# Patient Record
Sex: Male | Born: 1960
Health system: Southern US, Community
[De-identification: ages and names within clinical notes are randomized; demographics above are authoritative.]

## PROBLEM LIST (undated history)

## (undated) DIAGNOSIS — E785 Hyperlipidemia, unspecified: Secondary | ICD-10-CM

## (undated) DIAGNOSIS — I341 Nonrheumatic mitral (valve) prolapse: Secondary | ICD-10-CM

## (undated) DIAGNOSIS — R011 Cardiac murmur, unspecified: Secondary | ICD-10-CM

## (undated) DIAGNOSIS — K219 Gastro-esophageal reflux disease without esophagitis: Secondary | ICD-10-CM

## (undated) DIAGNOSIS — F419 Anxiety disorder, unspecified: Secondary | ICD-10-CM

## (undated) HISTORY — PX: MOUTH SURGERY: SHX715

## (undated) HISTORY — DX: Cardiac murmur, unspecified: R01.1

## (undated) HISTORY — DX: Gastro-esophageal reflux disease without esophagitis: K21.9

## (undated) HISTORY — PX: VASECTOMY: SHX75

## (undated) HISTORY — DX: Hyperlipidemia, unspecified: E78.5

## (undated) HISTORY — DX: Anxiety disorder, unspecified: F41.9

## (undated) HISTORY — DX: Nonrheumatic mitral (valve) prolapse: I34.1

---
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
## 1994-12-08 HISTORY — PX: LASIK: SHX215

## 2014-12-08 HISTORY — PX: MEDIAL PARTIAL KNEE REPLACEMENT: SHX5965

## 2015-12-21 DIAGNOSIS — M179 Osteoarthritis of knee, unspecified: Secondary | ICD-10-CM | POA: Insufficient documentation

## 2015-12-21 DIAGNOSIS — M1711 Unilateral primary osteoarthritis, right knee: Secondary | ICD-10-CM | POA: Insufficient documentation

## 2015-12-21 DIAGNOSIS — M1710 Unilateral primary osteoarthritis, unspecified knee: Secondary | ICD-10-CM | POA: Insufficient documentation

## 2016-11-18 LAB — HM COLONOSCOPY

## 2016-11-24 ENCOUNTER — Ambulatory Visit: Payer: Self-pay | Admitting: Podiatry

## 2016-12-18 ENCOUNTER — Encounter: Payer: Self-pay | Admitting: Gastroenterology

## 2017-02-17 DIAGNOSIS — R5381 Other malaise: Secondary | ICD-10-CM | POA: Diagnosis not present

## 2017-03-10 DIAGNOSIS — K219 Gastro-esophageal reflux disease without esophagitis: Secondary | ICD-10-CM | POA: Diagnosis not present

## 2017-03-17 ENCOUNTER — Encounter: Payer: Self-pay | Admitting: Gastroenterology

## 2017-04-15 ENCOUNTER — Ambulatory Visit (INDEPENDENT_AMBULATORY_CARE_PROVIDER_SITE_OTHER): Payer: 59 | Admitting: Gastroenterology

## 2017-04-15 ENCOUNTER — Encounter (INDEPENDENT_AMBULATORY_CARE_PROVIDER_SITE_OTHER): Payer: Self-pay

## 2017-04-15 ENCOUNTER — Encounter: Payer: Self-pay | Admitting: Gastroenterology

## 2017-04-15 ENCOUNTER — Ambulatory Visit: Payer: Self-pay | Admitting: Gastroenterology

## 2017-04-15 VITALS — BP 120/58 | HR 68 | Ht 74.0 in | Wt 219.0 lb

## 2017-04-15 DIAGNOSIS — Z86010 Personal history of colonic polyps: Secondary | ICD-10-CM

## 2017-04-15 DIAGNOSIS — K219 Gastro-esophageal reflux disease without esophagitis: Secondary | ICD-10-CM | POA: Diagnosis not present

## 2017-04-15 NOTE — Patient Instructions (Addendum)
Patient advised to avoid spicy, acidic, citrus, chocolate, mints, fruit and fruit juices.  Limit the intake of caffeine, alcohol and Soda.  Don't exercise too soon after eating.  Don't lie down within 3-4 hours of eating.  Elevate the head of your bed.  You have been scheduled for an endoscopy. Please follow written instructions given to you at your visit today. If you use inhalers (even only as needed), please bring them with you on the day of your procedure. Your physician has requested that you go to www.startemmi.com and enter the access code given to you at your visit today. This web site gives a general overview about your procedure. However, you should still follow specific instructions given to you by our office regarding your preparation for the procedure.  Please bring your previous Gastrointestinal records back to our office or you can fax them to [phone].  Thank you for choosing me and [facility].  Venita LickMalcolm T. Pleas KochStark, Jr., MD., Clementeen GrahamFACG

## 2017-04-15 NOTE — Progress Notes (Signed)
History of Present Illness: This is a 56 year old male referred by Serena Colonelosen, Jefry, MD for the evaluation of GERD and throat problems. He was evaluated by Dr. Serena ColonelJefry Rosen in April. ENT exam was unremarkable. Patient relates occasional sore throat and burning epigastric pain. The symptoms have been frequent over the past year. Patient relates a history of reflux problems frequently during his 30s and then for many years he did not notice reflux until the past year. He notes alcohol, late night eating, caffeine and chocolate precipitates symptoms. He has not previously undergone EGD. He has undergone colonoscopy on 2 occasions by his report and the most recent colon[place] in [place]. The patient relates he had 1 precancerous polyp on his most recent colonoscopy and a 5 year interval colonoscopy was recommended. Denies weight loss, abdominal pain, constipation, diarrhea, change in stool caliber, melena, hematochezia, nausea, vomiting, dysphagia, chest pain.   Not on File Outpatient Medications Prior to Visit  Medication Sig Dispense Refill  . Calcium Ascorbate 500 MG TABS Take 1 tablet by mouth daily.    . Ergocalciferol (VITAMIN D2) 2000 units TABS Take 1 tablet by mouth daily.    Marland Kitchen. omega-3 acid ethyl esters (LOVAZA) 1 g capsule Take 1 g by mouth daily.    . vitamin A 4098110000 UNIT capsule Take 10,000 Units by mouth daily.    . vitamin E 100 UNIT capsule Take 100 Units by mouth daily.     No facility-administered medications prior to visit.    No past medical history on file. Past Surgical History:  Procedure Laterality Date  . KNEE SURGERY Right 12/2015  . MOUTH SURGERY     Social History   Social History  . Marital status: Married    Spouse name: N/A  . Number of children: N/A  . Years of education: N/A   Social History Main Topics  . Smoking status: Never Smoker  . Smokeless tobacco: Never Used  . Alcohol use No  . Drug use: Unknown  . Sexual activity: Not  Asked   Other Topics Concern  . None   Social History Narrative  . None   History reviewed. No pertinent family history.    Review of Systems: Pertinent positive and negative review of systems were noted in the above HPI section. All other review of systems were otherwise negative.   Physical Exam: General: Well developed, well nourished, no acute distress Head: Normocephalic and atraumatic Eyes:  sclerae anicteric, EOMI Ears: Normal auditory acuity Mouth: No deformity or lesions Neck: Supple, no masses or thyromegaly Lungs: Clear throughout to auscultation Heart: Regular rate and rhythm; no murmurs, rubs or bruits Abdomen: Soft, non tender and non distended. No masses, hepatosplenomegaly or hernias noted. Normal Bowel sounds Rectal: not done Musculoskeletal: Symmetrical with no gross deformities  Skin: No lesions on visible extremities Pulses:  Normal pulses noted Extremities: No clubbing, cyanosis, edema or deformities noted Neurological: Alert oriented x 4, grossly nonfocal Cervical Nodes:  No significant cervical adenopathy Inguinal Nodes: No significant inguinal adenopathy Psychological:  Alert and cooperative. Normal mood and affect  Assessment and Recommendations:  1. GERD. R/O Barrett's, esophagitis. Follow all standard antireflux measures. Zantac OTC 150 mg twice a day when necessary and if this is not effective omeprazole 20 mg OTC daily as needed for management of symptoms. Schedule EGD. The risks (including bleeding, perforation, infection, missed lesions, medication reactions and possible hospitalization or surgery if complications occur), benefits, and alternatives to endoscopy with  possible biopsy and possible dilation were discussed with the patient and they consent to proceed.   2. Personal history of a precancerous colon polyp by pts history. Surveillance colonoscopy 11/2021. Attempt to obtain records from prior colonoscopies.   cc: Serena Colonel, MD [address] [place], [place] [address]

## 2017-05-21 ENCOUNTER — Encounter: Payer: Self-pay | Admitting: Gastroenterology

## 2017-05-22 DIAGNOSIS — M2550 Pain in unspecified joint: Secondary | ICD-10-CM | POA: Diagnosis not present

## 2017-05-22 DIAGNOSIS — R509 Fever, unspecified: Secondary | ICD-10-CM | POA: Diagnosis not present

## 2017-06-04 ENCOUNTER — Encounter: Payer: Self-pay | Admitting: Gastroenterology

## 2017-06-04 ENCOUNTER — Ambulatory Visit (AMBULATORY_SURGERY_CENTER): Payer: 59 | Admitting: Gastroenterology

## 2017-06-04 VITALS — BP 138/86 | HR 61 | Temp 98.9°F | Resp 14 | Ht 74.0 in | Wt 219.0 lb

## 2017-06-04 DIAGNOSIS — K219 Gastro-esophageal reflux disease without esophagitis: Secondary | ICD-10-CM | POA: Diagnosis present

## 2017-06-04 MED ORDER — SODIUM CHLORIDE 0.9 % IV SOLN: 500.0000 mL | INTRAVENOUS | Status: DC

## 2017-06-04 NOTE — Patient Instructions (Signed)
YOU HAD AN ENDOSCOPIC PROCEDURE TODAY AT THE [facility]:   Refer to the procedure report that was given to you for any specific questions about what was found during the examination.  If the procedure report does not answer your questions, please call your gastroenterologist to clarify.  If you requested that your care partner not be given the details of your procedure findings, then the procedure report has been included in a sealed envelope for you to review at your convenience later.  YOU SHOULD EXPECT: Some feelings of bloating in the abdomen. Passage of more gas than usual.  Walking can help get rid of the air that was put into your GI tract during the procedure and reduce the bloating. If you had a lower endoscopy (such as a colonoscopy or flexible sigmoidoscopy) you may notice spotting of blood in your stool or on the toilet paper. If you underwent a bowel prep for your procedure, you may not have a normal bowel movement for a few days.  Please Note:  You might notice some irritation and congestion in your nose or some drainage.  This is from the oxygen used during your procedure.  There is no need for concern and it should clear up in a day or so.  SYMPTOMS TO REPORT IMMEDIATELY:    Following upper endoscopy (EGD)  Vomiting of blood or coffee ground material  New chest pain or pain under the shoulder blades  Painful or persistently difficult swallowing  New shortness of breath  Fever of 100F or higher  Black, tarry-looking stools  Please see handouts given to you by your recovery nurse. For urgent or emergent issues, a gastroenterologist can be reached at any hour by calling [phone].   DIET:  We do recommend a small meal at first, but then you may proceed to your regular diet.  Drink plenty of fluids but you should avoid alcoholic beverages for 24 hours.  ACTIVITY:  You should plan to take it easy for the rest of today and you should NOT DRIVE or use heavy  machinery until tomorrow (because of the sedation medicines used during the test).    FOLLOW UP: Our staff will call the number listed on your records the next business day following your procedure to check on you and address any questions or concerns that you may have regarding the information given to you following your procedure. If we do not reach you, we will leave a message.  However, if you are feeling well and you are not experiencing any problems, there is no need to return our call.  We will assume that you have returned to your regular daily activities without incident.  If any biopsies were taken you will be contacted by phone or by letter within the next 1-3 weeks.  Please call us at [phone] if you have not heard about the biopsies in 3 weeks.    SIGNATURES/CONFIDENTIALITY: You and/or your care partner have signed paperwork which will be entered into your electronic medical record.  These signatures attest to the fact that that the information above on your After Visit Summary has been reviewed and is understood.  Full responsibility of the confidentiality of this discharge information lies with you and/or your care-partner.  Thank you for letting us take care of your healthcare needs today.

## 2017-06-04 NOTE — Progress Notes (Signed)
Report to PACU, RN, vss, BBS= Clear.  

## 2017-06-04 NOTE — Op Note (Signed)
[facility] Patient Name: Nathan Ward Procedure Date: 06/04/2017 10:06 AM MRN: [id number] Endoscopist: Meryl Dare , MD Age: 56 Referring MD:  Date of Birth: October 09, 1961 Gender: Male Account #: [id number] Procedure:                Upper GI endoscopy Indications:              Gastro-esophageal reflux disease Medicines:                Monitored Anesthesia Care Procedure:                Pre-Anesthesia Assessment:                           - Prior to the procedure, a History and Physical                            was performed, and patient medications and                            allergies were reviewed. The patient's tolerance of                            previous anesthesia was also reviewed. The risks                            and benefits of the procedure and the sedation                            options and risks were discussed with the patient.                            All questions were answered, and informed consent                            was obtained. Prior Anticoagulants: The patient has                            taken no previous anticoagulant or antiplatelet                            agents. ASA Grade Assessment: II - A patient with                            mild systemic disease. After reviewing the risks                            and benefits, the patient was deemed in                            satisfactory condition to undergo the procedure.                           After obtaining informed consent, the endoscope was  passed under direct vision. Throughout the                            procedure, the patient's blood pressure, pulse, and                            oxygen saturations were monitored continuously. The                            Endoscope was introduced through the mouth, and                            advanced to the second part of duodenum. The upper                            GI endoscopy was  accomplished without difficulty.                            The patient tolerated the procedure well. Scope In: Scope Out: Findings:                 The esophagus was normal.                           The stomach was normal.                           The examined duodenum was normal.                           The cardia and gastric fundus were normal on                            retroflexion. Complications:            No immediate complications. Estimated Blood Loss:     Estimated blood loss: none. Impression:               - Normal esophagus.                           - Normal stomach.                           - Normal examined duodenum.                           - No specimens collected. Recommendation:           - Patient has a contact number available for                            emergencies. The signs and symptoms of potential                            delayed complications were discussed with the  patient. Return to normal activities tomorrow.                            Written discharge instructions were provided to the                            patient.                           - Resume previous diet.                           - Antireflux measures.                           - Continue present medications. Meryl DareMalcolm T Stark, MD 06/04/2017 10:22:56 AM This report has been signed electronically.

## 2017-06-05 ENCOUNTER — Telehealth: Payer: Self-pay

## 2017-06-05 NOTE — Telephone Encounter (Signed)
  Follow up Call-  Call back number 06/04/2017  Post procedure Call Back phone  # [phone]  Permission to leave phone message Yes     Patient questions:  Do you have a fever, pain , or abdominal swelling? No. Pain Score  0 *  Have you tolerated food without any problems? Yes.    Have you been able to return to your normal activities? Yes.    Do you have any questions about your discharge instructions: Diet   No. Medications  No. Follow up visit  No.  Do you have questions or concerns about your Care? No.  Actions: * If pain score is 4 or above: No action needed, pain <4.

## 2017-10-08 DIAGNOSIS — Z23 Encounter for immunization: Secondary | ICD-10-CM | POA: Diagnosis not present

## 2018-01-21 DIAGNOSIS — L820 Inflamed seborrheic keratosis: Secondary | ICD-10-CM | POA: Diagnosis not present

## 2018-02-10 NOTE — Progress Notes (Signed)
Cardiology Office Note   Date:  02/12/2018   ID:  Dorn Hartshorne, DOB [birth year], MRN [id number]  PCP:  Renford Dills, MD  Cardiologist:   Charlton Haws, MD   No chief complaint on file.     History of Present Illness: Nathan Ward is a 57 y.o. male who presents for consultation regarding risk of CAD. He has history of MVP, anxiety HLD not wanting to take statins. Family history positive for premature  CAD.  Had cardiac CTA done at [facility] 06/24/2013 with isolated plaque in LAD Score 2.4 with no significant CAD in right dominant system. Commented on 50% or worse distal LAD disease but sounds like it was poorly visualized and just small distal LAD.   He has atypical chest and back pain Seems muscular positional not pleuritc Not always with Exertion Lasts minutes He anxiety level with he and mother regarding diagnosis of Lpa and  Family history of stroke and MI   Past Medical History:  Diagnosis Date  . Anxiety   . GERD (gastroesophageal reflux disease)   . Heart murmur    MVP  . Hyperlipidemia   . Mitral valve prolapse     Past Surgical History:  Procedure Laterality Date  . KNEE SURGERY Right 12/2015  . LASIK    . MOUTH SURGERY    . VASECTOMY       Current Outpatient Medications  Medication Sig Dispense Refill  . cholecalciferol (VITAMIN D) 400 units TABS tablet Take 400 Units by mouth as directed.    . vitamin A 40981 UNIT capsule Take 10,000 Units by mouth daily.    . vitamin E 100 UNIT capsule Take 100 Units by mouth daily.     Current Facility-Administered Medications  Medication Dose Route Frequency Provider Last Rate Last Dose  . 0.9 %  sodium chloride infusion  500 mL Intravenous Continuous Meryl Dare, MD        Allergies:   Sulfamethoxazole    Social History:  The patient  reports that  has never smoked. he has never used smokeless tobacco. He reports that he drinks about 0.6 oz of alcohol per week. He reports that he does not use drugs.     Family History:  The patient's family history includes Colon cancer in his father; Colon polyps in his father; Diabetes in his father and maternal uncle; Heart attack in his brother and maternal grandfather; Heart disease in his brother and maternal grandfather; Lymphoma in his father; Other in his father and maternal uncle.    ROS:  Please see the history of present illness.   Otherwise, review of systems are positive for none.   All other systems are reviewed and negative.    PHYSICAL EXAM: VS:  BP 136/76   Pulse 61   Ht 6\' 2"  (1.88 m)   Wt 209 lb (94.8 kg)   BMI 26.83 kg/m  , BMI Body mass index is 26.83 kg/m. Affect appropriate Healthy:  appears stated age HEENT: normal Neck supple with no adenopathy JVP normal no bruits no thyromegaly Lungs clear with no wheezing and good diaphragmatic motion Heart:  S1/S2 no murmur, no rub, gallop or click PMI normal Abdomen: benighn, BS positve, no tenderness, no AAA no bruit.  No HSM or HJR Distal pulses intact with no bruits No edema Neuro non-focal Skin warm and dry No muscular weakness    EKG:  2014 SR rate 51 normal    Recent Labs: No results found for requested  labs within last 8760 hours.    Lipid Panel No results found for: CHOL, TRIG, HDL, CHOLHDL, VLDL, LDLCALC, LDLDIRECT    Wt Readings from Last 3 Encounters:  02/11/18 209 lb (94.8 kg)  06/04/17 219 lb (99.3 kg)  04/15/17 219 lb (99.3 kg)      Other studies Reviewed: Additional studies/ records that were reviewed today include: Notes [facility] cardiology and cardiac CTA done 2014 .    ASSESSMENT AND PLAN:  1.  CAD by previous CT family history atypical pain f/u cardiac CTA/calcium score 2. MVP no murmur on exam no need for echo  3 HLD will see if calcium score above average for age and pursue statin Rx if so  4. Anxiety seems to drive cardiac evaluation    Current medicines are reviewed at length with the patient today.  The patient does not have concerns  regarding medicines.  The following changes have been made:  no change  Labs/ tests ordered today include: cardiac CTA/Calcium Score   Orders Placed This Encounter  Procedures  . CT CORONARY MORPH W/CTA COR W/SCORE W/CA W/CM &/OR WO/CM  . CT CORONARY FRACTIONAL FLOW RESERVE DATA PREP  . CT CORONARY FRACTIONAL FLOW RESERVE FLUID ANALYSIS  . Basic metabolic panel  . EKG 12-Lead     Disposition:   FU with cardiology PRN      Signed, Charlton HawsPeter Bethanny Toelle, MD  02/12/2018 2:28 PM    [facility] [address], [place], [place]  [address] Phone: [phone](336) [phone]; Fax: [phone]

## 2018-02-11 ENCOUNTER — Ambulatory Visit (INDEPENDENT_AMBULATORY_CARE_PROVIDER_SITE_OTHER): Payer: 59 | Admitting: Cardiovascular Disease

## 2018-02-11 ENCOUNTER — Encounter: Payer: Self-pay | Admitting: Cardiovascular Disease

## 2018-02-11 VITALS — BP 136/76 | HR 61 | Ht 74.0 in | Wt 209.0 lb

## 2018-02-11 DIAGNOSIS — E785 Hyperlipidemia, unspecified: Secondary | ICD-10-CM

## 2018-02-11 DIAGNOSIS — I341 Nonrheumatic mitral (valve) prolapse: Secondary | ICD-10-CM

## 2018-02-11 DIAGNOSIS — R079 Chest pain, unspecified: Secondary | ICD-10-CM | POA: Diagnosis not present

## 2018-02-11 DIAGNOSIS — I2510 Atherosclerotic heart disease of native coronary artery without angina pectoris: Secondary | ICD-10-CM

## 2018-02-11 NOTE — Patient Instructions (Addendum)
Medication Instructions:  Your physician recommends that you continue on your current medications as directed. Please refer to the Current Medication list given to you today.  Labwork: Your physician recommends that you return for lab work in: 1 week -BMET.   Testing/Procedures: Your physician has requested that you have cardiac CT. Cardiac computed tomography (CT) is a painless test that uses an x-ray machine to take clear, detailed pictures of your heart. For further information please visit [email or web address]. Please follow instruction sheet as given.  Follow-Up: Your physician wants you to follow-up in: 12 months with Dr. Eden EmmsNishan. You will receive a reminder letter in the mail two months in advance. If you don't receive a letter, please call our office to schedule the follow-up appointment.   If you need a refill on your cardiac medications before your next appointment, please call your pharmacy.  Please arrive at the [facility] main entrance of [facility] at xx:xx AM (30-45 minutes prior to test start time)  [facility] [address] [place], [place] [address] [phone]  Proceed to the [facility] Radiology Department (First Floor).  Please follow these instructions carefully (unless otherwise directed):  Hold all erectile dysfunction medications at least 48 hours prior to test.  On the Night Before the Test: . Drink plenty of water. . Do not consume any caffeinated/decaffeinated beverages or chocolate 12 hours prior to your test. . Do not take any antihistamines 12 hours prior to your test.  On the Day of the Test: . Drink plenty of water. Do not drink any water within one hour of the test. . Do not eat any food 4 hours prior to the test. . You may take your regular medications prior to the test. . IF NOT ON A BETA BLOCKER - Take 50 mg of lopressor (metoprolol) one hour before the test.  After the Test: . Drink plenty of water. . After  receiving IV contrast, you may experience a mild flushed feeling. This is normal. . On occasion, you may experience a mild rash up to 24 hours after the test. This is not dangerous. If this occurs, you can take Benadryl 25 mg and increase your fluid intake. . If you experience trouble breathing, this can be serious. If it is severe call 911 IMMEDIATELY. If it is mild, please call our office. . If you take any of these medications: Glipizide/Metformin, Avandament, Glucavance, please do not take 48 hours after completing test.

## 2018-02-12 ENCOUNTER — Telehealth: Payer: Self-pay

## 2018-02-12 NOTE — Telephone Encounter (Signed)
-----   Message from Ethelda ChickPamela Pate Ingalls, RN sent at 02/12/2018  3:52 PM EST ----- See if patient needs to take metoprolol before his cardiac CT.

## 2018-02-12 NOTE — Telephone Encounter (Signed)
Per Dr. Eden EmmsNishan, patient does not need metoprolol before his cardiac CT.

## 2018-02-15 ENCOUNTER — Other Ambulatory Visit: Payer: Self-pay

## 2018-02-18 ENCOUNTER — Other Ambulatory Visit: Payer: 59

## 2018-02-18 DIAGNOSIS — I341 Nonrheumatic mitral (valve) prolapse: Secondary | ICD-10-CM

## 2018-02-18 DIAGNOSIS — I2510 Atherosclerotic heart disease of native coronary artery without angina pectoris: Secondary | ICD-10-CM

## 2018-02-18 DIAGNOSIS — R079 Chest pain, unspecified: Secondary | ICD-10-CM

## 2018-02-18 DIAGNOSIS — E785 Hyperlipidemia, unspecified: Secondary | ICD-10-CM

## 2018-02-19 LAB — BASIC METABOLIC PANEL WITH GFR
BUN/Creatinine Ratio: 14 (ref 9–20)
BUN: 14 mg/dL (ref 6–24)
CO2: 22 mmol/L (ref 20–29)
Calcium: 9.1 mg/dL (ref 8.7–10.2)
Chloride: 103 mmol/L (ref 96–106)
Creatinine, Ser: 1 mg/dL (ref 0.76–1.27)
GFR calc Af Amer: 95 mL/min/{1.73_m2}
GFR calc non Af Amer: 83 mL/min/{1.73_m2}
Glucose: 89 mg/dL (ref 65–99)
Potassium: 4.3 mmol/L (ref 3.5–5.2)
Sodium: 141 mmol/L (ref 134–144)

## 2018-03-15 ENCOUNTER — Ambulatory Visit (HOSPITAL_COMMUNITY)
Admission: RE | Admit: 2018-03-15 | Discharge: 2018-03-15 | Disposition: A | Discharge status: Home / Self Care | Payer: 59 | Source: Ambulatory Visit | Attending: Cardiovascular Disease | Admitting: Cardiovascular Disease

## 2018-03-15 ENCOUNTER — Ambulatory Visit (HOSPITAL_COMMUNITY): Admission: RE | Admit: 2018-03-15 | Payer: 59 | Source: Ambulatory Visit

## 2018-03-15 DIAGNOSIS — R079 Chest pain, unspecified: Secondary | ICD-10-CM | POA: Insufficient documentation

## 2018-03-15 DIAGNOSIS — I341 Nonrheumatic mitral (valve) prolapse: Secondary | ICD-10-CM | POA: Insufficient documentation

## 2018-03-15 DIAGNOSIS — E785 Hyperlipidemia, unspecified: Secondary | ICD-10-CM | POA: Diagnosis not present

## 2018-03-15 DIAGNOSIS — I2510 Atherosclerotic heart disease of native coronary artery without angina pectoris: Secondary | ICD-10-CM | POA: Diagnosis not present

## 2018-03-15 MED ORDER — NITROGLYCERIN 0.4 MG SL SUBL
SUBLINGUAL_TABLET | SUBLINGUAL | Status: AC
  Administered 2018-03-15: 0.8 mg via SUBLINGUAL
  Filled 2018-03-15: qty 2

## 2018-03-15 MED ORDER — IOPAMIDOL (ISOVUE-370) INJECTION 76%
80.0000 mL | Freq: Once | INTRAVENOUS | Status: AC | PRN
  Administered 2018-03-15: 80 mL via INTRAVENOUS

## 2018-03-15 MED ORDER — NITROGLYCERIN 0.4 MG SL SUBL
0.8000 mg | SUBLINGUAL_TABLET | Freq: Once | SUBLINGUAL | Status: AC
  Administered 2018-03-15: 0.8 mg via SUBLINGUAL

## 2018-03-15 MED ORDER — IOPAMIDOL (ISOVUE-370) INJECTION 76%
INTRAVENOUS | Status: AC
  Filled 2018-03-15: qty 100

## 2018-03-15 MED ORDER — METOPROLOL TARTRATE 5 MG/5ML IV SOLN
INTRAVENOUS | Status: AC
  Filled 2018-03-15: qty 10

## 2018-03-15 MED ORDER — METOPROLOL TARTRATE 5 MG/5ML IV SOLN: 5.0000 mg | INTRAVENOUS | Status: DC | PRN

## 2018-03-16 ENCOUNTER — Telehealth: Payer: Self-pay

## 2018-03-16 DIAGNOSIS — E7800 Pure hypercholesterolemia, unspecified: Secondary | ICD-10-CM

## 2018-03-16 DIAGNOSIS — Z0000 Encounter for general adult medical examination without abnormal findings: Secondary | ICD-10-CM

## 2018-03-16 MED ORDER — ATORVASTATIN CALCIUM 10 MG PO TABS: 10.0000 mg | ORAL_TABLET | Freq: Every day | ORAL | 3 refills | Status: DC

## 2018-03-16 NOTE — Telephone Encounter (Signed)
-----   Message from Wendall StadePeter C Nishan, MD sent at 03/15/2018  3:53 PM EDT ----- I don't recall talking to him about Dr Drue Novelpaz but if he has no primary can refer His LDL is 140 with positive Lpa so if he wants to do everything he can To prevent MI would start lipitor 10 mg and f/u labs in 3 months

## 2018-03-16 NOTE — Telephone Encounter (Signed)
Patient stated he does not have a PCP. Will put in referral for Dr. Drue NovelPaz. Patient will start Lipitor 10 mg and will come in for fasting labs in 3 months.

## 2018-03-25 NOTE — Telephone Encounter (Signed)
Please advise 

## 2018-03-25 NOTE — Telephone Encounter (Signed)
Okay to schedule NP appt at his convenience.  

## 2018-03-25 NOTE — Telephone Encounter (Signed)
Appt scheduled 04/07/18 @10 :40

## 2018-03-25 NOTE — Telephone Encounter (Signed)
New Pt referral from Dr. Eden EmmsNishan- okay to schedule?

## 2018-03-25 NOTE — Telephone Encounter (Signed)
Please do

## 2018-04-06 DIAGNOSIS — I341 Nonrheumatic mitral (valve) prolapse: Secondary | ICD-10-CM | POA: Insufficient documentation

## 2018-04-06 DIAGNOSIS — E785 Hyperlipidemia, unspecified: Secondary | ICD-10-CM | POA: Insufficient documentation

## 2018-04-06 DIAGNOSIS — F419 Anxiety disorder, unspecified: Secondary | ICD-10-CM | POA: Insufficient documentation

## 2018-04-07 ENCOUNTER — Encounter: Payer: Self-pay | Admitting: Internal Medicine

## 2018-04-07 ENCOUNTER — Ambulatory Visit (INDEPENDENT_AMBULATORY_CARE_PROVIDER_SITE_OTHER): Payer: 59 | Admitting: Internal Medicine

## 2018-04-07 VITALS — BP 132/68 | HR 61 | Temp 98.1°F | Resp 16 | Ht 74.0 in | Wt 209.2 lb

## 2018-04-07 DIAGNOSIS — E785 Hyperlipidemia, unspecified: Secondary | ICD-10-CM

## 2018-04-07 NOTE — Patient Instructions (Signed)
GO TO THE FRONT DESK  Schedule labs to be done in 6 weeks, fasting Schedule your next appointment for a sickle exam in 4 to 5 months.  Fasting as well   GENERAL INFORMATION ABOUT HEALTHY EATING, CHOLESTEROL  The American Heart Association, www.heart.org  The American diabetes Association www.diabetes.org  "[facility] A to Z Health Guide" book

## 2018-04-07 NOTE — Progress Notes (Signed)
Pre visit review using our clinic review tool, if applicable. No additional management support is needed unless otherwise documented below in the visit note. 

## 2018-04-07 NOTE — Progress Notes (Signed)
Subjective:    Patient ID: Nathan Ward, male    DOB: [birth year], 59 y.o.   MRN: [id number]  DOS:  04/07/2018 Type of visit - description : New patient, referred by cardiology Interval history: In general the patient feels great, his main concern is to get established and work on his  cardiovascular risk reduction. Since he saw cardiology, coronary CT showed he is average risk for his age, cardiology recommended Lipitor 10 mg. He took it for a few days but stopped, liked to discuss with me.   Review of Systems he is actually doing great, in the last few weeks he has changed his diet dramatically, he remains very active taking walks every day and going to the gym regularly.   Past Medical History:  Diagnosis Date  . Anxiety   . GERD (gastroesophageal reflux disease)   . Heart murmur    MVP  . Hyperlipidemia   . Mitral valve prolapse 1976    Past Surgical History:  Procedure Laterality Date  . LASIK  1996  . MEDIAL PARTIAL KNEE REPLACEMENT Right 2016   in [place]  . MOUTH SURGERY    . VASECTOMY      Social History   Socioeconomic History  . Marital status: Married    Spouse name: Not on file  . Number of children: 3  . Years of education: Not on file  . Highest education level: Not on file  Occupational History  . Occupation: Engineer, manufacturing , Conservation officer, nature  . Occupation: finances   Social Needs  . Financial resource strain: Not on file  . Food insecurity:    Worry: Not on file    Inability: Not on file  . Transportation needs:    Medical: Not on file    Non-medical: Not on file  Tobacco Use  . Smoking status: Never Smoker  . Smokeless tobacco: Never Used  Substance and Sexual Activity  . Alcohol use: Yes    Alcohol/week: 0.6 oz    Types: 1 Glasses of wine per week    Comment: occ  . Drug use: No  . Sexual activity: Not on file  Lifestyle  . Physical activity:    Days per week: Not on file    Minutes per session: Not on file  . Stress: Not on file   Relationships  . Social connections:    Talks on phone: Not on file    Gets together: Not on file    Attends religious service: Not on file    Active member of club or organization: Not on file    Attends meetings of clubs or organizations: Not on file    Relationship status: Not on file  . Intimate partner violence:    Fear of current or ex partner: Not on file    Emotionally abused: Not on file    Physically abused: Not on file    Forced sexual activity: Not on file  Other Topics Concern  . Not on file  Social History Narrative   Moved from [place] 2017   2nd marriage      Family History  Problem Relation Age of Onset  . Colon cancer Father        14  . Colon polyps Father   . Diabetes Father   . Other Father        acute myelogenous leukemia  . Lymphoma Father        non-Hodgkin's  . Heart disease Brother   . Heart  attack Brother   . Diabetes Maternal Uncle   . Other Maternal Uncle        heart surgery: valve sx  . Heart disease Maternal Grandfather   . Heart attack Maternal Grandfather      Allergies as of 04/07/2018      Reactions   Sulfamethoxazole Hives      Medication List        Accurate as of 04/07/18 11:59 PM. Always use your most recent med list.          atorvastatin 10 MG tablet Commonly known as:  LIPITOR Take 1 tablet (10 mg total) by mouth daily.   [employer] OMEGA-3 KRILL OIL PO Take by mouth.   multivitamin tablet Take 1 tablet by mouth daily. BILLY'S INFINITY GREENS   OCUVITE ADULT 50+ Caps Take by mouth.          Objective:   Physical Exam BP 132/68 (BP Location: Right Arm, Patient Position: Sitting, Cuff Size: Normal)   Pulse 61   Temp 98.1 F (36.7 C) (Oral)   Resp 16   Ht  (1.88 m)   Wt 209 lb 4 oz (94.9 kg)   SpO2 98%   BMI 26.87 kg/m  General:   Well developed, well nourished . NAD.  HEENT:  Normocephalic . Face symmetric, atraumatic Neck: No thyromegaly Lungs:  CTA B Normal respiratory effort, no intercostal  retractions, no accessory muscle use. Heart: RRR,  no murmur.  No pretibial edema bilaterally  Skin: Not pale. Not jaundice Neurologic:  alert & oriented X3.  Speech normal, gait appropriate for age and unassisted Psych--  Cognition and judgment appear intact.  Cooperative with normal attention span and concentration.  Behavior appropriate. No anxious or depressed appearing.      Assessment & Plan:    Assessment  (new patient, 04/2018, referred by Dr. Eden Emms) Heart murmur, MVP dx 1976 per pt  Hyperlipidemia Anxiety GERD FH CAD 03/2018: CT coronary nephrology with a score: average for age, no obstructive CAD.  Plan: CV RF control  PLAN Mild dyslipidemia: The patient had a Lipo--profile several weeks ago, I cannot find the report in the chart, per Dr. Eden Emms notes LDL was 140. Given his family history I will set a LDL goal of 100. We discussed the role of diet and exercise.  Also the fact that Lipitor  Has beneficial  properties beyond lowering the LDL. We discussed fish oil and aspirin, that is  more controversial for primary prevention. At the end we agreed on continue lifestyle modification, Lipitor 10 mg daily, labs in 6 weeks and physical in 4 to 5 months. Also, I agreed to see his mother as a new patient

## 2018-04-08 DIAGNOSIS — Z09 Encounter for follow-up examination after completed treatment for conditions other than malignant neoplasm: Secondary | ICD-10-CM | POA: Insufficient documentation

## 2018-04-08 NOTE — Assessment & Plan Note (Signed)
Mild dyslipidemia: The patient had a Lipo--profile several weeks ago, I cannot find the report in the chart, per Dr. Eden Emms notes LDL was 140. Given his family history I will set a LDL goal of 100. We discussed the role of diet and exercise.  Also the fact that Lipitor  Has beneficial  properties beyond lowering the LDL. We discussed fish oil and aspirin, that is  more controversial for primary prevention. At the end we agreed on continue lifestyle modification, Lipitor 10 mg daily, labs in 6 weeks and physical in 4 to 5 months. Also, I agreed to see his mother as a new patient

## 2018-05-19 ENCOUNTER — Other Ambulatory Visit: Payer: 59

## 2018-05-24 ENCOUNTER — Telehealth: Payer: Self-pay | Admitting: Internal Medicine

## 2018-05-24 ENCOUNTER — Telehealth: Payer: Self-pay

## 2018-05-24 NOTE — Telephone Encounter (Signed)
Copied from CRM [phone]. Topic: Quick Communication - See Telephone Encounter >> May 24, 2018  1:33 PM Cipriano BunkerLambe, Annette S wrote: CRM for notification. See Telephone encounter for: 05/24/18.  He is asking for referral to Psychiatrist or pharmaceutist for medciation

## 2018-05-24 NOTE — Telephone Encounter (Signed)
Patient new to the area would like to be referred to a Psychiatrist (Not Psychologist per patient wants M.D.) for Anxiety and Depression. No major problems at this time but would like the referral as soon as possible per patient.

## 2018-05-24 NOTE — Telephone Encounter (Signed)
Advised patient, needs to contact his insurance, see what psychiatrist is in network and call made his own appointment. From previous experience, that may take several weeks, I will be happy to see him for evaluation. Please ask if he has any major symptoms like suicidal ideas if that is the case let me know.

## 2018-05-25 NOTE — Telephone Encounter (Signed)
Called patient states he does not want to check with his insurance. States he wants to know if you know a Psychiatrist personally that he could be referred to. Patient denies symptoms at this time when asked. States he will be in tomorrow to have blood drawn and if he could be given the name of a Psychiatrist it would be appreciated.

## 2018-05-25 NOTE — Telephone Encounter (Signed)
Recommend to reach out to:  [facility], Dr Jennelle Humanottle and others Phone: [phone]  [address], [address] [place] [address]   ===================  [facility]. Dr Donell BeersPlovsky and others [address] [place], [place] [address]  [phone]

## 2018-05-26 ENCOUNTER — Other Ambulatory Visit: Payer: 59

## 2018-05-26 NOTE — Telephone Encounter (Signed)
Called patient gave names and numbers of Dr. Jennelle Humanottle and Dr. Donell BeersPlovsky. Left detailed message on answering machine. Asked patient to return call if he has any questions.

## 2018-06-01 ENCOUNTER — Encounter (HOSPITAL_COMMUNITY): Payer: Self-pay | Admitting: Emergency Medicine

## 2018-06-01 ENCOUNTER — Telehealth: Payer: Self-pay | Admitting: Cardiovascular Disease

## 2018-06-01 ENCOUNTER — Ambulatory Visit (HOSPITAL_COMMUNITY)
Admission: EM | Admit: 2018-06-01 | Discharge: 2018-06-01 | Disposition: A | Discharge status: Home / Self Care | Payer: 59 | Attending: Family Medicine | Admitting: Family Medicine

## 2018-06-01 DIAGNOSIS — W450XXA Nail entering through skin, initial encounter: Secondary | ICD-10-CM

## 2018-06-01 DIAGNOSIS — S91332A Puncture wound without foreign body, left foot, initial encounter: Secondary | ICD-10-CM

## 2018-06-01 DIAGNOSIS — Z23 Encounter for immunization: Secondary | ICD-10-CM

## 2018-06-01 DIAGNOSIS — Z5189 Encounter for other specified aftercare: Secondary | ICD-10-CM

## 2018-06-01 MED ORDER — TETANUS-DIPHTH-ACELL PERTUSSIS 5-2.5-18.5 LF-MCG/0.5 IM SUSP
INTRAMUSCULAR | Status: AC
  Filled 2018-06-01: qty 0.5

## 2018-06-01 MED ORDER — TETANUS-DIPHTH-ACELL PERTUSSIS 5-2.5-18.5 LF-MCG/0.5 IM SUSP
0.5000 mL | Freq: Once | INTRAMUSCULAR | Status: AC
  Administered 2018-06-01: 0.5 mL via INTRAMUSCULAR

## 2018-06-01 NOTE — Telephone Encounter (Signed)
New message  Pt wants to know if he can get a referral to an endocrinologist. Please call

## 2018-06-01 NOTE — Discharge Instructions (Addendum)
Remember to keep a record of today's immunization in your wallet

## 2018-06-01 NOTE — ED Provider Notes (Signed)
[facility]   [id number] 06/01/18 Arrival Time: 0956   SUBJECTIVE:  ALANZO LAMB is a 57 y.o. male who presents to the urgent care with complaint of stepping on nail two days ago.  No pain or redness.  Just needs dT.     Past Medical History:  Diagnosis Date  . Anxiety   . GERD (gastroesophageal reflux disease)   . Heart murmur    MVP  . Hyperlipidemia   . Mitral valve prolapse 1976   Family History  Problem Relation Age of Onset  . Colon cancer Father        66  . Colon polyps Father   . Diabetes Father   . Other Father        acute myelogenous leukemia  . Lymphoma Father        non-Hodgkin's  . Heart disease Brother   . Heart attack Brother   . Diabetes Maternal Uncle   . Other Maternal Uncle        heart surgery: valve sx  . Heart disease Maternal Grandfather   . Heart attack Maternal Grandfather    Social History   Socioeconomic History  . Marital status: Married    Spouse name: Not on file  . Number of children: 3  . Years of education: Not on file  . Highest education level: Not on file  Occupational History  . Occupation: Engineer, manufacturing , Conservation officer, nature  . Occupation: finances   Social Needs  . Financial resource strain: Not on file  . Food insecurity:    Worry: Not on file    Inability: Not on file  . Transportation needs:    Medical: Not on file    Non-medical: Not on file  Tobacco Use  . Smoking status: Never Smoker  . Smokeless tobacco: Never Used  Substance and Sexual Activity  . Alcohol use: Yes    Alcohol/week: 0.6 oz    Types: 1 Glasses of wine per week    Comment: occ  . Drug use: No  . Sexual activity: Not on file  Lifestyle  . Physical activity:    Days per week: Not on file    Minutes per session: Not on file  . Stress: Not on file  Relationships  . Social connections:    Talks on phone: Not on file    Gets together: Not on file    Attends religious service: Not on file    Active member of club or  organization: Not on file    Attends meetings of clubs or organizations: Not on file    Relationship status: Not on file  . Intimate partner violence:    Fear of current or ex partner: Not on file    Emotionally abused: Not on file    Physically abused: Not on file    Forced sexual activity: Not on file  Other Topics Concern  . Not on file  Social History Narrative   Moved from [place] 2017   2nd marriage    Current Facility-Administered Medications for the 06/01/18 encounter [facility] Encounter)  Medication  . 0.9 %  sodium chloride infusion   No outpatient medications have been marked as taking for the 06/01/18 encounter [facility] Encounter).   Allergies  Allergen Reactions  . Sulfamethoxazole Hives      ROS: As per HPI, remainder of ROS negative.   OBJECTIVE:   Vitals:   06/01/18 1015 06/01/18 1016  Pulse:  (!) 46  Resp:  16  Temp:  98.3 F (36.8 C)  TempSrc:  Oral  SpO2:  99%  Weight: 209 lb (94.8 kg)      General appearance: alert; no distress Eyes: PERRL; EOMI; conjunctiva normal HENT: normocephalic; atraumatic; Neck: supple Extremities: no cyanosis or edema; symmetrical with no gross deformities Skin: warm and dry Neurologic: normal gait; grossly normal Psychological: alert and cooperative; normal mood and affect      Labs:  Results for orders placed or performed in visit on 02/18/18  Basic metabolic panel  Result Value Ref Range   Glucose 89 65 - 99 mg/dL   BUN 14 6 - 24 mg/dL   Creatinine, Ser 4.091.00 0.76 - 1.27 mg/dL   GFR calc non Af Amer 83 >59 mL/min/1.73   GFR calc Af Amer 95 >59 mL/min/1.73   BUN/Creatinine Ratio 14 9 - 20   Sodium 141 134 - 144 mmol/L   Potassium 4.3 3.5 - 5.2 mmol/L   Chloride 103 96 - 106 mmol/L   CO2 22 20 - 29 mmol/L   Calcium 9.1 8.7 - 10.2 mg/dL    Labs Reviewed - No data to display  No results found.     ASSESSMENT & PLAN:  1. Visit for wound check     Meds ordered this encounter  Medications  .  Tdap (BOOSTRIX) injection 0.5 mL    Reviewed expectations re: course of current medical issues. Questions answered. Outlined signs and symptoms indicating need for more acute intervention. Patient verbalized understanding. After Visit Summary given.    Procedures:      Elvina SidleLauenstein, Calvina Liptak, MD 06/01/18 1031

## 2018-06-01 NOTE — Telephone Encounter (Signed)
Left message for patient to call back. Left detailed message that patient could call PCP for referral.

## 2018-06-01 NOTE — ED Triage Notes (Signed)
PT stepped on a rusty nail two days ago and needed a tetanus shot

## 2018-06-07 ENCOUNTER — Encounter: Payer: Self-pay | Admitting: Internal Medicine

## 2018-06-07 ENCOUNTER — Ambulatory Visit (INDEPENDENT_AMBULATORY_CARE_PROVIDER_SITE_OTHER): Payer: 59 | Admitting: Internal Medicine

## 2018-06-07 VITALS — BP 124/76 | HR 59 | Temp 97.4°F | Resp 16 | Ht 74.0 in | Wt 205.5 lb

## 2018-06-07 DIAGNOSIS — E785 Hyperlipidemia, unspecified: Secondary | ICD-10-CM

## 2018-06-07 DIAGNOSIS — F419 Anxiety disorder, unspecified: Secondary | ICD-10-CM

## 2018-06-07 NOTE — Patient Instructions (Addendum)
See you in October  Cancel the lab appointment for July   Front desk: Please schedule pt's wife French Anaracy for a new pt visit w/ me tomorrow at 1.30 pm  Please get any available records

## 2018-06-07 NOTE — Progress Notes (Signed)
Subjective:    Patient ID: Nathan Ward, male    DOB: 1961/11/03, 57 y.o.   MRN: [id number]  DOS:  06/07/2018 Type of visit - description : Acute Interval history: Since the last office visit, he re- started Lipitor, he again felt that his mind was not sharp, he was forgetful. He decided to stop the medication a week ago and since then is very apparent that he is better.  He also got some records for my review.  Having some anxiety, mostly related to his wife who is having a lot of anxiety, depression and suicidal thoughts.  Review of Systems Overall other than above he is doing really well, he has changed his diet, is eating much healthier, he is very active, has lost some weight.   Past Medical History:  Diagnosis Date  . Anxiety   . GERD (gastroesophageal reflux disease)   . Heart murmur    MVP  . Hyperlipidemia   . Mitral valve prolapse 1976    Past Surgical History:  Procedure Laterality Date  . LASIK  1996  . MEDIAL PARTIAL KNEE REPLACEMENT Right 2016   in [place]  . MOUTH SURGERY    . VASECTOMY      Social History   Socioeconomic History  . Marital status: Married    Spouse name: Not on file  . Number of children: 3  . Years of education: Not on file  . Highest education level: Not on file  Occupational History  . Occupation: Engineer, manufacturing , Conservation officer, nature  . Occupation: finances   Social Needs  . Financial resource strain: Not on file  . Food insecurity:    Worry: Not on file    Inability: Not on file  . Transportation needs:    Medical: Not on file    Non-medical: Not on file  Tobacco Use  . Smoking status: Never Smoker  . Smokeless tobacco: Never Used  Substance and Sexual Activity  . Alcohol use: Yes    Alcohol/week: 0.6 oz    Types: 1 Glasses of wine per week    Comment: occ  . Drug use: No  . Sexual activity: Not on file  Lifestyle  . Physical activity:    Days per week: Not on file    Minutes per session: Not on file  . Stress:  Not on file  Relationships  . Social connections:    Talks on phone: Not on file    Gets together: Not on file    Attends religious service: Not on file    Active member of club or organization: Not on file    Attends meetings of clubs or organizations: Not on file    Relationship status: Not on file  . Intimate partner violence:    Fear of current or ex partner: Not on file    Emotionally abused: Not on file    Physically abused: Not on file    Forced sexual activity: Not on file  Other Topics Concern  . Not on file  Social History Narrative   Moved from [place] 2017   2nd marriage       Allergies as of 06/07/2018      Reactions   Sulfamethoxazole Hives      Medication List        Accurate as of 06/07/18  2:09 PM. Always use your most recent med list.          atorvastatin 10 MG tablet Commonly known as:  LIPITOR  Take 10 mg by mouth daily.   CVS OMEGA-3 KRILL OIL PO Take by mouth.   multivitamin tablet Take 1 tablet by mouth daily. [employer] INFINITY GREENS   OCUVITE ADULT 50+ Caps Take by mouth.          Objective:   Physical Exam BP 124/76 (BP Location: Left Arm, Patient Position: Sitting, Cuff Size: Small)   Pulse (!) 59   Temp (!) 97.4 F (36.3 C) (Oral)   Resp 16   Ht 6\' 2"  (1.88 m)   Wt 205 lb 8 oz (93.2 kg)   SpO2 98%   BMI 26.38 kg/m  General:   Well developed, NAD, see BMI.  HEENT:  Normocephalic . Face symmetric, atraumatic  Neurologic:  alert & oriented X3.  Speech normal, gait appropriate for age and unassisted Psych--  Cognition and judgment appear intact.  Cooperative with normal attention span and concentration.  Behavior appropriate. Slightly anxious but no depressed appearing.      Assessment & Plan:     Assessment  (new patient, 04/2018, referred by Dr. Eden EmmsNishan) Heart murmur, MVP dx 1976 per pt  Hyperlipidemia Anxiety GERD FH CAD 03/2018: CT coronary nephrology with a score: average for age, no obstructive CAD.  Plan: CV RF  control  PLAN Mild dyslipidemia: Started Lipitor and stopped a week ago because he feels that he become forgetful and his mind was not sharp, had similar experience in the past.  He has changed his diet, exercising more and would like to keep doing that and recheck his cholesterol in October. I shared with him some information from "UTD", indeed lipophilic statins have been linked w/ memory issues but that is not very common.  If we re-try statis will pick a  hydrophilic one such as Pravachol. Plan: Recheck cholesterol on RTC FH CAD: As above, not on aspirin Anxiety  Patient is concerned about his wife, she has expressed some suicidal ideas, I accepted her as a new patient and will see her tomorrow, until then  strongly recommend that she goes to go to the ER if S/I are intense, patient reports that he is closely checking on her. RTC October as planned

## 2018-06-07 NOTE — Progress Notes (Signed)
Pre visit review using our clinic review tool, if applicable. No additional management support is needed unless otherwise documented below in the visit note. 

## 2018-06-08 ENCOUNTER — Encounter: Payer: Self-pay | Admitting: Internal Medicine

## 2018-06-08 NOTE — Assessment & Plan Note (Signed)
Mild dyslipidemia: Started Lipitor and stopped a week ago because he feels that he become forgetful and his mind was not sharp, had similar experience in the past.  He has changed his diet, exercising more and would like to keep doing that and recheck his cholesterol in October. I shared with him some information from "UTD", indeed lipophilic statins have been linked w/ memory issues but that is not very common.  If we re-try statis will pick a  hydrophilic one such as Pravachol. Plan: Recheck cholesterol on RTC FH CAD: As above, not on aspirin Anxiety  Patient is concerned about his wife, she has expressed some suicidal ideas, I accepted her as a new patient and will see her tomorrow, until then  strongly recommend that she goes to go to the ER if S/I are intense, patient reports that he is closely checking on her. RTC October as planned

## 2018-06-21 ENCOUNTER — Other Ambulatory Visit: Payer: 59 | Admitting: *Deleted

## 2018-06-21 ENCOUNTER — Telehealth: Payer: Self-pay

## 2018-06-21 DIAGNOSIS — E7800 Pure hypercholesterolemia, unspecified: Secondary | ICD-10-CM

## 2018-06-21 LAB — LIPID PANEL
CHOL/HDL RATIO: 3.7 ratio (ref 0.0–5.0)
Cholesterol, Total: 199 mg/dL (ref 100–199)
HDL: 54 mg/dL (ref >39)
LDL CALC: 128 mg/dL — AB (ref 0–99)
Triglycerides: 86 mg/dL (ref 0–149)
VLDL CHOLESTEROL CAL: 17 mg/dL (ref 5–40)

## 2018-06-21 LAB — HEPATIC FUNCTION PANEL
ALBUMIN: 4.5 g/dL (ref 3.5–5.5)
ALK PHOS: 57 IU/L (ref 39–117)
ALT: 24 IU/L (ref 0–44)
AST: 23 IU/L (ref 0–40)
Bilirubin Total: 1 mg/dL (ref 0.0–1.2)
Bilirubin, Direct: 0.21 mg/dL (ref 0.00–0.40)
TOTAL PROTEIN: 6.2 g/dL (ref 6.0–8.5)

## 2018-06-21 NOTE — Telephone Encounter (Signed)
-----   Message from Wendall StadePeter C Nishan, MD sent at 06/21/2018  4:37 PM EDT ----- LDL is 128 given family history and CAD by CT would have him start lipitor 10 mg daily and f/u labs in 3 months with Dr Drue NovelPaz

## 2018-06-21 NOTE — Telephone Encounter (Addendum)
Called patient with lab results. Per Dr. Eden EmmsNishan, LDL is 128 given family history and CAD by CT would have him start lipitor 10 mg daily and f/u labs in 3 months with Dr Drue NovelPaz. Patient stated "Lipitor makes me stupid". Patient stated lipitor makes him very forgetful, and wanted to know if there was another medication he could take instead. Patient also informed me that he is on a Vegan diet that he started two weeks ago. Patient stated he has cut out gluten, dairy and no meat, except for fish every once in a while. Patient is wondering if this diet will work to get his LDL down. Will send message to Dr. Eden EmmsNishan for advisement.

## 2018-06-30 NOTE — Telephone Encounter (Signed)
Patient has follow up with Dr. Drue NovelPaz. No medication changes at this time.

## 2018-09-09 ENCOUNTER — Encounter: Payer: 59 | Admitting: Internal Medicine

## 2018-09-20 ENCOUNTER — Ambulatory Visit (HOSPITAL_COMMUNITY)
Admission: EM | Admit: 2018-09-20 | Discharge: 2018-09-20 | Disposition: A | Discharge status: Home / Self Care | Payer: 59

## 2018-09-20 ENCOUNTER — Encounter (HOSPITAL_COMMUNITY): Payer: Self-pay | Admitting: Emergency Medicine

## 2018-09-20 DIAGNOSIS — S0181XA Laceration without foreign body of other part of head, initial encounter: Secondary | ICD-10-CM

## 2018-09-20 NOTE — ED Triage Notes (Signed)
Pt here for wound to forehead

## 2018-09-20 NOTE — Discharge Instructions (Signed)
You may take 500mg Tylenol with ibuprofen 400-600mg every 6 hours for pain and inflammation. °

## 2018-09-20 NOTE — ED Provider Notes (Signed)
  MRN: [id number] DOB: 1961/03/23  Subjective:   Nathan Ward is a 57 y.o. male presenting for suffering a superficial facial laceration over his forehead.  Patient collided with a piece of wood off the side of his house.  Reports that he has his Tdap updated, received 1 2 months ago from stepping on a nail.  Patient came straight here and did not apply anything to his wound.    Denies taking any medications currently.     Allergies  Allergen Reactions  . Sulfamethoxazole Hives    Past Medical History:  Diagnosis Date  . Anxiety   . GERD (gastroesophageal reflux disease)   . Heart murmur    MVP  . Hyperlipidemia   . Mitral valve prolapse 1976     Past Surgical History:  Procedure Laterality Date  . LASIK  1996  . MEDIAL PARTIAL KNEE REPLACEMENT Right 2016   in [place]  . MOUTH SURGERY    . VASECTOMY      Objective:   Vitals: BP 133/69 (BP Location: Left Arm)   Pulse 60   Temp 98.4 F (36.9 C) (Oral)   Resp 20   SpO2 98%   Physical Exam  Constitutional: He is oriented to person, place, and time. He appears well-developed and well-nourished.  HENT:  Head:    Cardiovascular: Normal rate.  Pulmonary/Chest: Effort normal.  Neurological: He is alert and oriented to person, place, and time.   Wound cleansed using the wound cleansing spray here at our clinic.  1/8 inch Steri-Strips applied thereafter.  A total of 8 were used with good tension.  Assessment and Plan :   Superficial laceration of face  Wound care reviewed.  Return to clinic precautions provided. Use APAP and ibuprofen for pain and inflammation.      Wallis Bamberg, [place] 09/20/18 2032

## 2018-09-21 ENCOUNTER — Telehealth: Payer: Self-pay

## 2018-09-21 DIAGNOSIS — S0181XA Laceration without foreign body of other part of head, initial encounter: Secondary | ICD-10-CM

## 2018-09-21 NOTE — Telephone Encounter (Signed)
Need further information for referral.

## 2018-09-21 NOTE — Telephone Encounter (Signed)
Copied from CRM [id number]. Topic: Referral - Request for Referral >> Sep 20, 2018  6:02 PM Darletta Moll L wrote: Has patient seen PCP for this complaint? No. *If NO, is insurance requiring patient see PCP for this issue before PCP can refer them? Referral for which specialty: plastic surgery Preferred provider/office:  did not mention, hung up the phone when being told he needs to see Drue Novel first Reason for referral: mentioned "closing up his face"

## 2018-09-21 NOTE — Telephone Encounter (Signed)
Pt see at ED yesterday for laceration to face. He is requesting plastic surgery referral. Will place.

## 2018-09-21 NOTE — Telephone Encounter (Signed)
We are unsure of what further information you need?

## 2018-10-13 ENCOUNTER — Encounter: Payer: 59 | Admitting: Internal Medicine

## 2018-10-27 ENCOUNTER — Encounter: Payer: Self-pay | Admitting: Internal Medicine

## 2018-10-27 ENCOUNTER — Ambulatory Visit (INDEPENDENT_AMBULATORY_CARE_PROVIDER_SITE_OTHER): Payer: 59 | Admitting: Internal Medicine

## 2018-10-27 VITALS — BP 120/66 | HR 64 | Temp 98.8°F | Resp 16 | Ht 74.0 in | Wt 195.0 lb

## 2018-10-27 DIAGNOSIS — Z23 Encounter for immunization: Secondary | ICD-10-CM | POA: Diagnosis not present

## 2018-10-27 DIAGNOSIS — Z114 Encounter for screening for human immunodeficiency virus [HIV]: Secondary | ICD-10-CM | POA: Diagnosis not present

## 2018-10-27 DIAGNOSIS — Z0000 Encounter for general adult medical examination without abnormal findings: Secondary | ICD-10-CM | POA: Insufficient documentation

## 2018-10-27 DIAGNOSIS — Z125 Encounter for screening for malignant neoplasm of prostate: Secondary | ICD-10-CM

## 2018-10-27 DIAGNOSIS — Z1159 Encounter for screening for other viral diseases: Secondary | ICD-10-CM | POA: Diagnosis not present

## 2018-10-27 LAB — CBC WITH DIFFERENTIAL/PLATELET
BASOS PCT: 0.6 % (ref 0.0–3.0)
Basophils Absolute: 0.1 10*3/uL (ref 0.0–0.1)
EOS PCT: 1.3 % (ref 0.0–5.0)
Eosinophils Absolute: 0.1 10*3/uL (ref 0.0–0.7)
HCT: 46 % (ref 39.0–52.0)
Hemoglobin: 15.8 g/dL (ref 13.0–17.0)
LYMPHS ABS: 2.4 10*3/uL (ref 0.7–4.0)
Lymphocytes Relative: 27.6 % (ref 12.0–46.0)
MCHC: 34.5 g/dL (ref 30.0–36.0)
MCV: 89.6 fl (ref 78.0–100.0)
MONOS PCT: 6.6 % (ref 3.0–12.0)
Monocytes Absolute: 0.6 10*3/uL (ref 0.1–1.0)
NEUTROS ABS: 5.5 10*3/uL (ref 1.4–7.7)
NEUTROS PCT: 63.9 % (ref 43.0–77.0)
PLATELETS: 263 10*3/uL (ref 150.0–400.0)
RBC: 5.13 Mil/uL (ref 4.22–5.81)
RDW: 12.7 % (ref 11.5–15.5)
WBC: 8.6 10*3/uL (ref 4.0–10.5)

## 2018-10-27 LAB — PSA: PSA: 1.17 ng/mL (ref 0.10–4.00)

## 2018-10-27 LAB — LIPID PANEL
CHOLESTEROL: 196 mg/dL (ref 0–200)
HDL: 48.2 mg/dL (ref >39.00)
LDL Cholesterol: 131 mg/dL — ABNORMAL HIGH (ref 0–99)
NonHDL: 147.34
Total CHOL/HDL Ratio: 4
Triglycerides: 84 mg/dL (ref 0.0–149.0)
VLDL: 16.8 mg/dL (ref 0.0–40.0)

## 2018-10-27 LAB — BASIC METABOLIC PANEL
BUN: 10 mg/dL (ref 6–23)
CALCIUM: 9.3 mg/dL (ref 8.4–10.5)
CO2: 29 mEq/L (ref 19–32)
Chloride: 101 mEq/L (ref 96–112)
Creatinine, Ser: 0.81 mg/dL (ref 0.40–1.50)
GFR: 103.47 mL/min (ref >60.00)
Glucose, Bld: 92 mg/dL (ref 70–99)
Potassium: 3.8 mEq/L (ref 3.5–5.1)
SODIUM: 138 meq/L (ref 135–145)

## 2018-10-27 LAB — HEMOGLOBIN A1C: Hgb A1c MFr Bld: 5.3 % (ref 4.6–6.5)

## 2018-10-27 LAB — TSH: TSH: 1.6 u[IU]/mL (ref 0.35–4.50)

## 2018-10-27 NOTE — Assessment & Plan Note (Signed)
Td 05/2018.  Flu shot today Request Shingrix No. 1, provided.  RTC 2 months. CCS: Had 2 [place], [place] had 4 polyps, was recommended 5 years. Prostate cancer screening: DRE normal, check a PSA Labs: BMP, FLP, CBC, A1c, TSH, PSA, HIV, hep C Diet and exercise discussed

## 2018-10-27 NOTE — Progress Notes (Signed)
Subjective:    Patient ID: Nathan Ward, male    DOB: September 12, 1961, 57 y.o.   MRN: [id number]  DOS:  10/27/2018 Type of visit - description : cpx Here for a CPX In the last few months, he has very stressed at work, working long hours. His diet has not been as good as before.  Review of Systems  Other than above, a 14 point review of systems is negative    Past Medical History:  Diagnosis Date  . Anxiety   . GERD (gastroesophageal reflux disease)   . Heart murmur    MVP  . Hyperlipidemia   . Mitral valve prolapse 1976    Past Surgical History:  Procedure Laterality Date  . LASIK  1996  . MEDIAL PARTIAL KNEE REPLACEMENT Right 2016   in [place]  . MOUTH SURGERY    . VASECTOMY      Social History   Socioeconomic History  . Marital status: Married    Spouse name: Not on file  . Number of children: 3  . Years of education: Not on file  . Highest education level: Not on file  Occupational History  . 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 Occupation: finances   Social Needs  . Financial resource strain: Not on file  . Food insecurity:    Worry: Not on file    Inability: Not on file  . Transportation needs:    Medical: Not on file    Non-medical: Not on file  Tobacco Use  . Smoking status: Never Smoker  . Smokeless tobacco: Never Used  Substance and Sexual Activity  . Alcohol use: Yes    Alcohol/week: 1.0 standard drinks    Types: 1 Glasses of wine per week    Comment: occ  . Drug use: No  . Sexual activity: Yes  Lifestyle  . Physical activity:    Days per week: Not on file    Minutes per session: Not on file  . Stress: Not on file  Relationships  . Social connections:    Talks on phone: Not on file    Gets together: Not on file    Attends religious service: Not on file    Active member of club or organization: Not on file    Attends meetings of clubs or organizations: Not on file    Relationship status: Not on file  . Intimate partner  violence:    Fear of current or ex partner: Not on file    Emotionally abused: Not on file    Physically abused: Not on file    Forced sexual activity: Not on file  Other Topics Concern  . Not on file  Social History Narrative   Moved from [place] 2017   2nd marriage    Family History  Problem Relation Age of Onset  . Colon cancer Father        1765  . Colon polyps Father   . Diabetes Father   . Other Father        acute myelogenous leukemia  . Lymphoma Father        non-Hodgkin's  . Heart disease Brother   . Heart attack Brother   . Diabetes Maternal Uncle   . Other Maternal Uncle        heart surgery: valve sx  . Heart disease Maternal Grandfather   . Heart attack Maternal Grandfather   . Prostate cancer Neg Hx      Allergies as of 10/27/2018  Reactions   Sulfamethoxazole Hives      Medication List    as of 10/27/2018 11:59 PM   You have not been prescribed any medications.         Objective:   Physical Exam BP 120/66 (BP Location: Left Arm, Patient Position: Sitting, Cuff Size: Normal)   Pulse 64   Temp 98.8 F (37.1 C)   Resp 16   Ht 6\' 2"  (1.88 m)   Wt 195 lb (88.5 kg)   SpO2 99%   BMI 25.04 kg/m  General: Well developed, NAD, BMI noted Neck: No  thyromegaly  HEENT:  Normocephalic . Face symmetric, atraumatic Lungs:  CTA B Normal respiratory effort, no intercostal retractions, no accessory muscle use. Heart: RRR,  no murmur.  No pretibial edema bilaterally  Abdomen:  Not distended, soft, non-tender. No rebound or rigidity.   pending Rectal: External abnormalities: none. Normal sphincter tone. No rectal masses or tenderness.  Brown stools Prostate: Prostate gland firm and smooth, no enlargement, nodularity, tenderness, mass, asymmetry or induration Skin: Exposed areas without rash. Not pale. Not jaundice Neurologic:  alert & oriented X3.  Speech normal, gait appropriate for age and unassisted Strength symmetric and appropriate for age.   Psych: Cognition and judgment appear intact.  Cooperative with normal attention span and concentration.  Behavior appropriate. No anxious or depressed appearing.     Assessment & Plan:    Assessment  (new patient, 04/2018, referred by Dr. Eden Emms) Heart murmur, MVP dx 1976 per pt  Hyperlipidemia Anxiety GERD FH CAD 03/2018: CT coronary nephrology with a score: average for age, no obstructive CAD.  Plan: CV RF control  PLAN Hyperlipidemia: His diet was great but in the last few months has not been able to eat as healthy.  We will check a FLP, we again talk about possibly Pravachol.  CV RF 10 years: 6.7%.  This was discussed with the patient Anxiety: Counseled, has access to a counselor if needed, recommend yoga, meditation and other nonpharmacological measures. RTC 1 year depending on results

## 2018-10-27 NOTE — Progress Notes (Signed)
Pre visit review using our clinic review tool, if applicable. No additional management support is needed unless otherwise documented below in the visit note. 

## 2018-10-27 NOTE — Patient Instructions (Signed)
GO TO THE LAB : Get the blood work     GO TO THE FRONT DESK Schedule your next Shingrix shot in 2 to 3 months from today  Schedule your next appointment for a checkup in 1 year  [email or web address]   The 10-year ASCVD risk score Denman George(Goff DC Montez HagemanJr., et al., 2013) is: 6.7%   Values used to calculate the score:     Age: [age] years     Sex: Male     Is Non-Hispanic African American: No     Diabetic: No     Tobacco smoker: No     Systolic Blood Pressure: 120 mmHg     Is BP treated: No     HDL Cholesterol: 54 mg/dL     Total Cholesterol: 199 mg/dL

## 2018-10-28 LAB — HEPATITIS C ANTIBODY
Hepatitis C Ab: NONREACTIVE
SIGNAL TO CUT-OFF: 0.01 (ref <1.00)

## 2018-10-28 LAB — HIV ANTIBODY (ROUTINE TESTING W REFLEX): HIV: NONREACTIVE

## 2018-10-28 NOTE — Assessment & Plan Note (Signed)
Hyperlipidemia: His diet was great but in the last few months has not been able to eat as healthy.  We will check a FLP, we again talk about possibly Pravachol.  CV RF 10 years: 6.7%.  This was discussed with the patient Anxiety: Counseled, has access to a counselor if needed, recommend yoga, meditation and other nonpharmacological measures. RTC 1 year depending on results

## 2018-11-14 ENCOUNTER — Other Ambulatory Visit: Payer: Self-pay

## 2018-11-14 ENCOUNTER — Emergency Department (HOSPITAL_COMMUNITY)
Admission: EM | Admit: 2018-11-14 | Discharge: 2018-11-14 | Disposition: A | Discharge status: Home / Self Care | Payer: 59 | Attending: Emergency Medicine | Admitting: Emergency Medicine

## 2018-11-14 ENCOUNTER — Encounter (HOSPITAL_COMMUNITY): Payer: Self-pay | Admitting: Emergency Medicine

## 2018-11-14 DIAGNOSIS — Y93H9 Activity, other involving exterior property and land maintenance, building and construction: Secondary | ICD-10-CM | POA: Diagnosis not present

## 2018-11-14 DIAGNOSIS — S01511A Laceration without foreign body of lip, initial encounter: Secondary | ICD-10-CM | POA: Diagnosis not present

## 2018-11-14 DIAGNOSIS — Y92009 Unspecified place in unspecified non-institutional (private) residence as the place of occurrence of the external cause: Secondary | ICD-10-CM | POA: Insufficient documentation

## 2018-11-14 DIAGNOSIS — Y999 Unspecified external cause status: Secondary | ICD-10-CM | POA: Insufficient documentation

## 2018-11-14 DIAGNOSIS — W208XXA Other cause of strike by thrown, projected or falling object, initial encounter: Secondary | ICD-10-CM | POA: Insufficient documentation

## 2018-11-14 NOTE — ED Provider Notes (Signed)
[facility] EMERGENCY DEPARTMENT Provider Note   CSN: [id number] Arrival date & time: 11/14/18  1458    History   Chief Complaint Chief Complaint  Patient presents with  . Lip Laceration    HPI Nathan Ward is a 57 y.o. male who presents with a lip laceration. No significant PMH. He states he was cleaning up his shed and a shovel fell and caused a upper lip laceration. The accident happened just prior to arrival. He called his daughter who is a pediatric ICU MD sent her a picture of it and she told him he could probably come to have it glued. Bleeding is controlled with pressure. He applied ice to the area. His tetanus is UTD. He denies any other complaints.  HPI  Past Medical History:  Diagnosis Date  . Anxiety   . GERD (gastroesophageal reflux disease)   . Heart murmur    MVP  . Hyperlipidemia   . Mitral valve prolapse 1976    Patient Active Problem List   Diagnosis Date Noted  . Annual physical exam 10/27/2018  . PCP NOTES >>>>>>>> 04/08/2018  . Mitral valve prolapse 04/06/2018  . Hyperlipidemia 04/06/2018  . Anxiety 04/06/2018  . Laryngopharyngeal reflux (LPR) 03/10/2017    Past Surgical History:  Procedure Laterality Date  . LASIK  1996  . MEDIAL PARTIAL KNEE REPLACEMENT Right 2016   in [place]  . MOUTH SURGERY    . VASECTOMY          Home Medications    Prior to Admission medications   Not on File    Family History Family History  Problem Relation Age of Onset  . Colon cancer Father        1965  . Colon polyps Father   . Diabetes Father   . Other Father        acute myelogenous leukemia  . Lymphoma Father        non-Hodgkin's  . Heart disease Brother   . Heart attack Brother   . Diabetes Maternal Uncle   . Other Maternal Uncle        heart surgery: valve sx  . Heart disease Maternal Grandfather   . Heart attack Maternal Grandfather   . Prostate cancer Neg Hx     Social History Social History   Tobacco Use  . Smoking  status: Never Smoker  . Smokeless tobacco: Never Used  Substance Use Topics  . Alcohol use: Yes    Alcohol/week: 1.0 standard drinks    Types: 1 Glasses of wine per week    Comment: occ  . Drug use: No     Allergies   Sulfamethoxazole   Review of Systems Review of Systems  Skin: Positive for wound.  Neurological: Negative for syncope and headaches.     Physical Exam Updated Vital Signs BP (!) 143/80   Pulse 63   Temp 98 F (36.7 C) (Oral)   Resp 16   Ht 6\' 2"  (1.88 m)   Wt 88.5 kg   SpO2 100%   BMI 25.04 kg/m   Physical Exam  Constitutional: He is oriented to person, place, and time. He appears well-developed and well-nourished. No distress.  HENT:  Head: Normocephalic and atraumatic.  Eyes: Pupils are equal, round, and reactive to light. Conjunctivae are normal. Right eye exhibits no discharge. Left eye exhibits no discharge. No scleral icterus.  Neck: Normal range of motion.  Cardiovascular: Normal rate.  Pulmonary/Chest: Effort normal. No respiratory distress.  Abdominal:  He exhibits no distension.  Neurological: He is alert and oriented to person, place, and time.  Skin: Skin is warm and dry.  1cm vertical linear laceration over the right upper lip that crosses the vermillion border. Wound is well approximated and superficial  Psychiatric: He has a normal mood and affect. His behavior is normal.  Nursing note and vitals reviewed.    ED Treatments / Results  Labs (all labs ordered are listed, but only abnormal results are displayed) Labs Reviewed - No data to display  EKG None  Radiology No results found.  Procedures .Marland KitchenLaceration Repair Date/Time: 11/14/2018 3:29 PM Performed by: Bethel Born, PA-C Authorized by: Bethel Born, PA-C   Consent:    Consent obtained:  Verbal   Consent given by:  Patient   Risks discussed:  Poor cosmetic result and pain   Alternatives discussed:  No treatment Anesthesia (see MAR for exact dosages):     Anesthesia method:  None Laceration details:    Location:  Lip   Lip location:  Upper exterior lip   Length (cm):  1   Depth (mm):  1 Repair type:    Repair type:  Simple Pre-procedure details:    Preparation:  Patient was prepped and draped in usual sterile fashion Exploration:    Wound exploration: wound explored through full range of motion and entire depth of wound probed and visualized   Treatment:    Area cleansed with:  Shur-Clens   Amount of cleaning:  Standard   Irrigation method:  Tap   Visualized foreign bodies/material removed: no   Skin repair:    Repair method:  Tissue adhesive Approximation:    Approximation:  Close Post-procedure details:    Dressing:  Open (no dressing)   Patient tolerance of procedure:  Tolerated well, no immediate complications   (including critical care time)    Medications Ordered in ED Medications - No data to display   Initial Impression / Assessment and Plan / ED Course  I have reviewed the triage vital signs and the nursing notes.  Pertinent labs & imaging results that were available during my care of the patient were reviewed by me and considered in my medical decision making (see chart for details).  57 year old male presents with superficial upper lip laceration.  It was irrigated and repaired with Dermabond in the ED.  Wound care was discussed with the patient.  His tetanus is up-to-date.  He was given return precautions  Final Clinical Impressions(s) / ED Diagnoses   Final diagnoses:  Lip laceration, initial encounter    ED Discharge Orders    None       Bethel Born, PA-C 11/14/18 1605    Linwood Dibbles, MD 11/14/18 [phone]

## 2018-11-14 NOTE — Discharge Instructions (Signed)
The dermabond will come off on its own in 5-7 days Do not apply any ointment to the wound because this may make the dermabond break down faster Cover area while showering Follow up if needed

## 2018-11-14 NOTE — ED Triage Notes (Signed)
C/O of lip laceration from shovel falling and hitting pt. Bleeding controled at this time.

## 2018-11-14 NOTE — ED Notes (Signed)
ED Provider at bedside. 

## 2018-11-14 NOTE — ED Notes (Signed)
Patient verbalizes understanding of discharge instructions. Opportunity for questioning and answers were provided. Armband removed by staff, pt discharged from ED ambulatory.   

## 2018-12-29 ENCOUNTER — Ambulatory Visit: Payer: 59

## 2019-02-08 ENCOUNTER — Telehealth: Payer: Self-pay | Admitting: *Deleted

## 2019-02-08 NOTE — Telephone Encounter (Signed)
Notified pt he is due for 2nd shingrix and scheduled nurse visit for 02/10/19 at 4pm.

## 2019-02-10 ENCOUNTER — Ambulatory Visit (INDEPENDENT_AMBULATORY_CARE_PROVIDER_SITE_OTHER): Payer: 59

## 2019-02-10 DIAGNOSIS — Z23 Encounter for immunization: Secondary | ICD-10-CM | POA: Diagnosis not present

## 2019-02-10 NOTE — Progress Notes (Signed)
Patient here for second shingrix vaccine. VIS given. 0.38ml shingrix given in patient's left deltoid IM. Patient tolerated well.  Pre visit review using our clinic review tool, if applicable. No additional management support is needed unless otherwise documented below in the visit note.

## 2019-07-26 NOTE — Progress Notes (Deleted)
Virtual Visit via Video Note   This visit type was conducted due to national recommendations for restrictions regarding the COVID-19 Pandemic (e.g. social distancing) in an effort to limit this patient's exposure and mitigate transmission in our community.  Due to his co-morbid illnesses, this patient is at least at moderate risk for complications without adequate follow up.  This format is felt to be most appropriate for this patient at this time.  All issues noted in this document were discussed and addressed.  A limited physical exam was performed with this format.  Please refer to the patient's chart for his consent to telehealth for [facility].   Date:  07/26/2019   ID:  Nathan Ward, DOB [birth year], MRN [id number]  Patient Location: Home Provider Location: Office  PCP:  Colon Branch, MD  Cardiologist:   Johnsie Cancel Electrophysiologist:  None   Evaluation Performed:  Follow-Up Visit  Chief Complaint:  CAD Risk  History of Present Illness:    Nathan Ward is a 58 y.o. male first seen 02/11/18  regarding risk of CAD. He has history of MVP, anxiety HLD not wanting to take statins. Family history positive for premature CAD.  Had cardiac CTA done at [facility] 06/24/2013 with isolated plaque in LAD Score 2.4 with no significant CAD in right dominant system. Commented on 50% or worse distal LAD disease but sounds like it was poorly visualized and just small distal LAD.   He has atypical chest and back pain Seems muscular positional not pleuritc Not always with Exertion Lasts minutes High anxiety level with he and mother regarding diagnosis of Lpa and  Family history of stroke and MI  Reviewed CTA done 03/15/18 mild calcium ostium D1 and distal LAD Calcium score 8 43 rd percentile Less than 50% ostial D1 and 30% distal LAD Aortic root 4.1   Labs reviewed 10/27/18 LDL 131 Dr Larose Kells suggested he take statin   ***  The patient  does not have symptoms concerning for COVID-19 infection  (fever, chills, cough, or new shortness of breath).    Past Medical History:  Diagnosis Date  . Anxiety   . GERD (gastroesophageal reflux disease)   . Heart murmur    MVP  . Hyperlipidemia   . Mitral valve prolapse 1976   Past Surgical History:  Procedure Laterality Date  . LASIK  1996  . MEDIAL PARTIAL KNEE REPLACEMENT Right 2016   in [place]    . VASECTOMY       No outpatient medications have been marked as taking for the 08/08/19 encounter (Appointment) with Josue Hector, MD.     Allergies:   Sulfamethoxazole   Social History   Tobacco Use  . Smoking status: Never Smoker  . Smokeless tobacco: Never Used  Substance Use Topics  . Alcohol use: Yes    Alcohol/week: 1.0 standard drinks    Types: 1 Glasses of wine per week    Comment: occ  . Drug use: No     Family Hx: The patient's family history includes Colon cancer in his father; Colon polyps in his father; Diabetes in his father and maternal uncle; Heart attack in his brother and maternal grandfather; Heart disease in his brother and maternal grandfather; Lymphoma in his father; Other in his father and maternal uncle. There is no history of Prostate cancer.  ROS:   Please see the history of present illness.     All other systems reviewed and are negative.  Prior CV studies:   The following studies were reviewed today:  Cardiac CTA 03/15/18  Labs/Other Tests and Data Reviewed:    EKG:  SR rate 51 normal 02/11/18  Recent Labs: 10/27/2018: BUN 10; Creatinine, Ser 0.81; Hemoglobin 15.8; Platelets 263.0; Potassium 3.8; Sodium 138; TSH 1.60   Recent Lipid Panel Lab Results  Component Value Date/Time   CHOL 196 10/27/2018 01:59 PM   CHOL 199 06/21/2018 08:02 AM   TRIG 84.0 10/27/2018 01:59 PM   HDL 48.20 10/27/2018 01:59 PM   HDL 54 06/21/2018 08:02 AM   CHOLHDL 4 10/27/2018 01:59 PM   LDLCALC 131 (H) 10/27/2018 01:59 PM   LDLCALC 128 (H) 06/21/2018 08:02 AM    Wt Readings from Last 3  Encounters:  11/14/18 195 lb (88.5 kg)  10/27/18 195 lb (88.5 kg)  06/07/18 205 lb 8 oz (93.2 kg)     Objective:    Vital Signs:  There were no vitals taken for this visit.   Skin warm and dry No distress No tachypnea No JVP elevation  Neuro appears non focal No edema  Telephone visit no exam   ASSESSMENT & PLAN:    1. CAD:  Non obstructive in D1/Distal LAD  Normal ECG 2. HLD:  *** 3. History MVP  No murmur observe 4. Anxiety *** 5. Dilated aortic root 4.1 cm on CT ***  COVID-19 Education: The signs and symptoms of COVID-19 were discussed with the patient and how to seek care for testing (follow up with PCP or arrange E-visit).  The importance of social distancing was discussed today.  Time:   Today, I have spent 30 minutes with the patient with telehealth technology discussing the above problems.     Medication Adjustments/Labs and Tests Ordered: Current medicines are reviewed at length with the patient today.  Concerns regarding medicines are outlined above.   Tests Ordered:  ***  Medication Changes:  ***  Disposition:  Follow up f/u in a year   Signed, Charlton HawsPeter Sukhdeep Wieting, MD  07/26/2019 10:41 AM    [facility]

## 2019-08-05 ENCOUNTER — Telehealth: Payer: Self-pay

## 2019-08-05 NOTE — Telephone Encounter (Signed)
Spoke with pt who wanted to reschedule appt on Monday, 31st with Dr. Johnsie Cancel to October 2nd with Dr. Johnsie Cancel in office. Pt verbalized understanding and thanked me for the call.

## 2019-08-08 ENCOUNTER — Ambulatory Visit: Payer: 59 | Admitting: Cardiovascular Disease

## 2019-08-29 ENCOUNTER — Other Ambulatory Visit: Payer: Self-pay | Admitting: Cardiology

## 2019-08-29 DIAGNOSIS — Z20822 Contact with and (suspected) exposure to covid-19: Secondary | ICD-10-CM

## 2019-08-31 LAB — NOVEL CORONAVIRUS, NAA: SARS-CoV-2, NAA: NOT DETECTED

## 2019-09-08 ENCOUNTER — Ambulatory Visit: Payer: Self-pay

## 2019-09-08 NOTE — Progress Notes (Deleted)
Cardiology Office Note   Date:  09/08/2019   ID:  Nathan Ward, Nathan Ward 27-Feb-1961, MRN [id number]  PCP:  Wanda Plump, MD  Cardiologist:   Charlton Haws, MD   No chief complaint on file.     History of Present Illness: Nathan Ward is a 58 y.o. male first seen March 2019  regarding risk of CAD. He has history of MVP, anxiety HLD not wanting to take statins. Lipitor made him feel "stupid"  Family history positive for premature  CAD.  Had cardiac CTA done at [facility] 06/24/2013 with isolated plaque in LAD Score 2.4 with no significant CAD in right dominant system. Commented on 50% or worse distal LAD disease but sounds like it was poorly visualized and just small distal LAD.   Cardiac CTA 03/15/18 reviewed Calcium score 8 in D1/Distal LAD 43 rd percentile for age/sex No obstructive CAD Aortic root 4.1 cm   He has atypical chest and back pain Seems muscular positional not pleuritc Not always with Exertion Lasts minutes He anxiety level with he and mother regarding diagnosis of Lpa and  Family history of stroke and MI   Did not want to start statin due to reaction with lipitor poor cognition Indicated Vegan diet   ***     Past Medical History:  Diagnosis Date  . Anxiety   . GERD (gastroesophageal reflux disease)   . Heart murmur    MVP  . Hyperlipidemia   . Mitral valve prolapse 1976    Past Surgical History:  Procedure Laterality Date  . LASIK  1996  . MEDIAL PARTIAL KNEE REPLACEMENT Right 2016   in [place]  . MOUTH SURGERY    . VASECTOMY       No current outpatient medications on file.   No current facility-administered medications for this visit.     Allergies:   Sulfamethoxazole    Social History:  The patient  reports that he has never smoked. He has never used smokeless tobacco. He reports current alcohol use of about 1.0 standard drinks of alcohol per week. He reports that he does not use drugs.   Family History:  The patient's family history  includes Colon cancer in his father; Colon polyps in his father; Diabetes in his father and maternal uncle; Heart attack in his brother and maternal grandfather; Heart disease in his brother and maternal grandfather; Lymphoma in his father; Other in his father and maternal uncle.    ROS:  Please see the history of present illness.   Otherwise, review of systems are positive for none.   All other systems are reviewed and negative.    PHYSICAL EXAM: VS:  There were no vitals taken for this visit. , BMI There is no height or weight on file to calculate BMI. Affect appropriate Healthy:  appears stated age HEENT: normal Neck supple with no adenopathy JVP normal no bruits no thyromegaly Lungs clear with no wheezing and good diaphragmatic motion Heart:  S1/S2 no murmur, no rub, gallop or click PMI normal Abdomen: benighn, BS positve, no tenderness, no AAA no bruit.  No HSM or HJR Distal pulses intact with no bruits No edema Neuro non-focal Skin warm and dry No muscular weakness    EKG:  2014 SR rate 51 normal    Recent Labs: 10/27/2018: BUN 10; Creatinine, Ser 0.81; Hemoglobin 15.8; Platelets 263.0; Potassium 3.8; Sodium 138; TSH 1.60    Lipid Panel    Component Value Date/Time   CHOL  196 10/27/2018 1359   CHOL 199 06/21/2018 0802   TRIG 84.0 10/27/2018 1359   HDL 48.20 10/27/2018 1359   HDL 54 06/21/2018 0802   CHOLHDL 4 10/27/2018 1359   VLDL 16.8 10/27/2018 1359   LDLCALC 131 (H) 10/27/2018 1359   LDLCALC 128 (H) 06/21/2018 0802      Wt Readings from Last 3 Encounters:  11/14/18 195 lb (88.5 kg)  10/27/18 195 lb (88.5 kg)  06/07/18 205 lb 8 oz (93.2 kg)      Other studies Reviewed: Additional studies/ records that were reviewed today include: Notes [facility] cardiology and cardiac CTA done 2014 .    ASSESSMENT AND PLAN:  1. CAD by previous CT family history atypical pain  Non obstructive by cardiac CTA 03/2018 2. MVP no murmur on exam no need for echo  3 HLD ***  4. Anxiety seems to drive cardiac evaluation    Current medicines are reviewed at length with the patient today.  The patient does not have concerns regarding medicines.  The following changes have been made:  ***  Labs/ tests ordered today include:  ***  No orders of the defined types were placed in this encounter.    Disposition:   FU with cardiology in a year     Signed, Jenkins Rouge, MD  09/08/2019 8:55 AM    [address], [place], [place]  [address] Phone: [phone]; Fax: [phone]

## 2019-09-09 ENCOUNTER — Ambulatory Visit: Payer: Self-pay | Admitting: Cardiovascular Disease

## 2019-10-27 IMAGING — CT CT HEART MORP W/ CTA COR W/ SCORE W/ CA W/CM &/OR W/O CM
4 of 7 series · 8 of 20 positions shown, 9 images · IV contrast (APPLIED)
Comparison: None.

CLINICAL DATA: Chest pain

EXAM:
Cardiac CTA
MEDICATIONS:
Sub lingual nitro. 4mg and lopressor 10mg
TECHNIQUE: The patient was scanned on a Siemens [REDACTED]ice Force scanner. Gantry
rotation speed was 270 msecs. Collimation was .9mm. A 100 kV
prospective scan was triggered in the descending thoracic aorta at
111 HU's with 5% padding centered around 78% of the R-R interval.
Average HR during the scan was 61 bpm. The 3D data set was
interpreted on a dedicated work station using MPR, MIP and VRT
modes. A total of 80cc of contrast was used.

[Series 6: best diast 68 % · axial · 0.37mm/px · z∈[+1002,+1057]mm · 2 of 412 slices shown, 3 images]
[im 138/412  vessel]
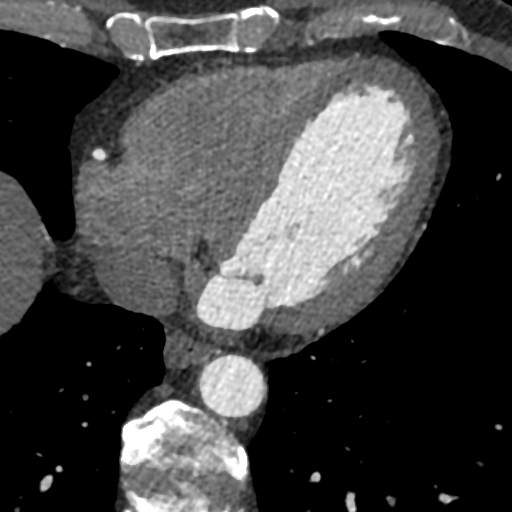
[im 138/412  lung]
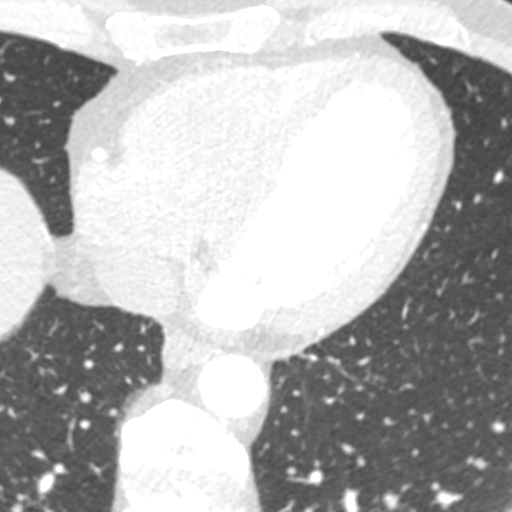
[im 275/412  vessel]
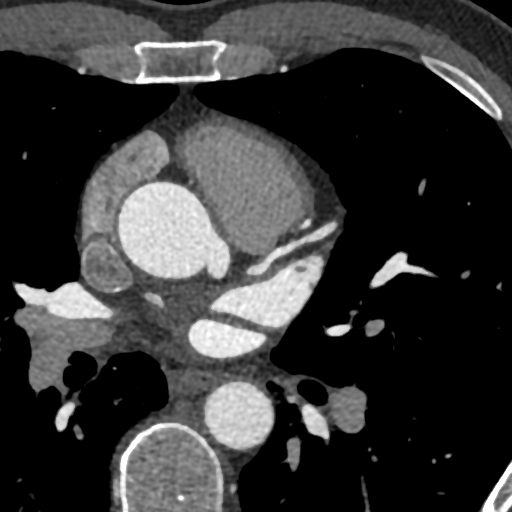

[Series 7: best syst 41 % · axial · 0.37mm/px · z∈[+1002,+1057]mm · 2 of 412 slices shown]
[im 138/412  vessel]
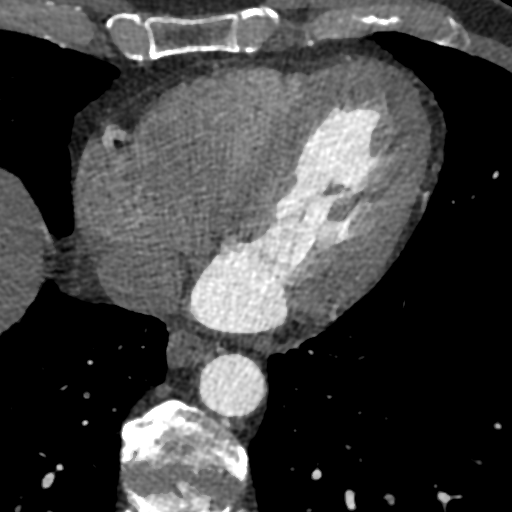
[im 275/412  vessel]
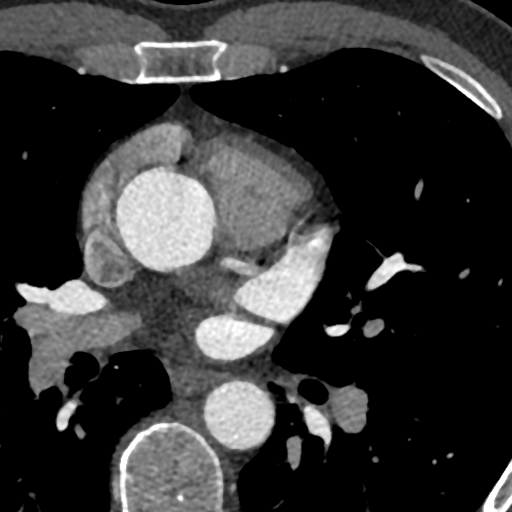

[Series 8: ts diast sharp 68 % · axial · 0.37mm/px · z∈[+1002,+1057]mm · 2 of 412 slices shown]
[im 138/412  lung]
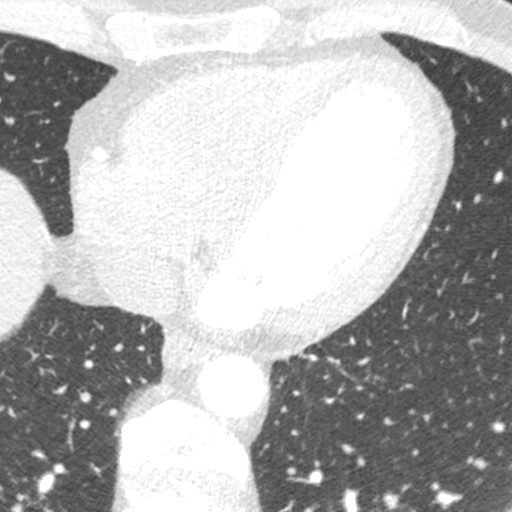
[im 275/412  lung]
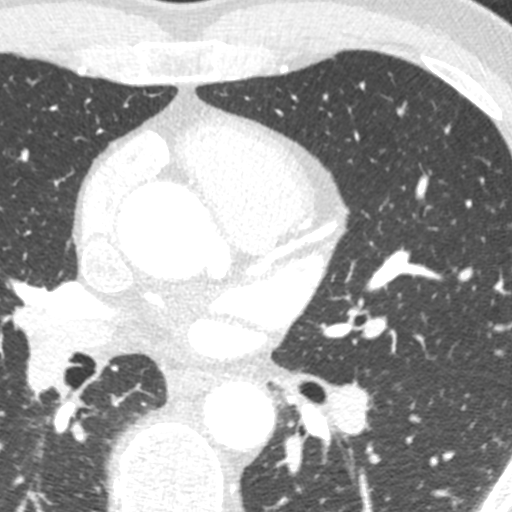

[Series 9: ts syst sharp 41 % · axial · 0.37mm/px · z∈[+1002,+1057]mm · 2 of 412 slices shown]
[im 138/412  lung]
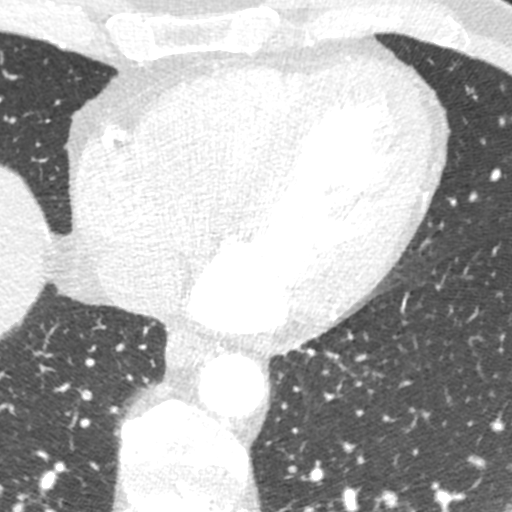
[im 275/412  lung]
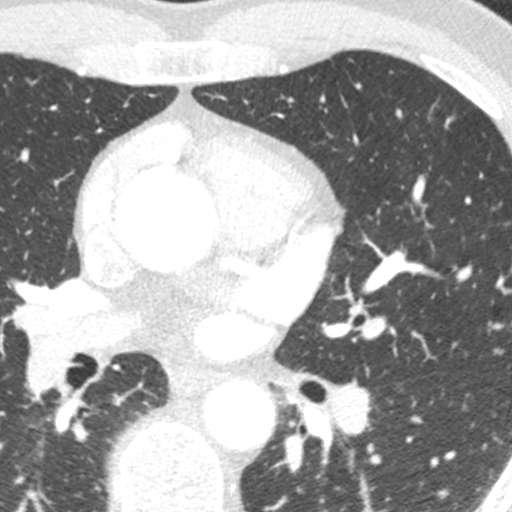

[8 of 20 positions shown; findings below may reference images not displayed]

FINDINGS: Non-cardiac: See separate report from [REDACTED]. No
significant findings on limited lung and soft tissue windows.

Calcium Score: Mild calcium noted at ostium D1 and distal LAD

Coronary Arteries: Right dominant with no anomalies

LM: Normal

LAD: Less than 30% distal calcific disease

D1: Less than 50% ostial calcific disease

D2: Normal

D3: Normal

IM: Small branch normal

Circumflex: Normal

OM1: Normal

OM2: Normal

OM3: Normal

RCA: Normal and dominant

PDA: Normal

PLA: Normal
IMPRESSION: 1. Calcium score 8 which is 43 [REDACTED] percentile for age and sex

2.  Mild aortic root dilatation 4.1 cm

3.  Non obstructive CAD involving the ostium of D1 and distal ODILIA

Paulus N Ceejay

EXAM:
OVER-READ INTERPRETATION  CT CHEST

The following report is an over-read performed by radiologist Dr.
Ilan Tiul [REDACTED] on 03/15/2018. This over-read
does not include interpretation of cardiac or coronary anatomy or
pathology. The coronary CTA interpretation by the cardiologist is
attached.
FINDINGS: Vascular: Heart is normal size.  Visualized aorta is normal caliber.

Mediastinum/Nodes: No adenopathy in the lower mediastinum or hila.

Lungs/Pleura: Visualized lungs clear.  No effusions.

Upper Abdomen: Imaging into the upper abdomen shows no acute
findings.

Musculoskeletal: Chest wall soft tissues are unremarkable. No acute
bony abnormality.
IMPRESSION: No acute or significant extracardiac abnormality.

## 2019-11-02 ENCOUNTER — Encounter: Payer: 59 | Admitting: Internal Medicine

## 2019-11-25 ENCOUNTER — Ambulatory Visit: Payer: 59 | Attending: Internal Medicine

## 2019-11-25 DIAGNOSIS — Z20822 Contact with and (suspected) exposure to covid-19: Secondary | ICD-10-CM

## 2019-11-27 LAB — NOVEL CORONAVIRUS, NAA: SARS-CoV-2, NAA: NOT DETECTED

## 2020-08-21 NOTE — Progress Notes (Signed)
Cardiology Office Note  Date:  08/21/2020   ID:  Nathan Ward, DOB 1961/08/28, MRN [id number]  PCP:  Wanda Plump, MD  Cardiologist:  Dr. Eden Emms  _____________  2 year follow-up  _____________   History of Present Illness: Nathan Ward is a 59 y.o. male has a history of CAD by CTA at [facility] 2014 showing isolated plaque in LAD with no significant CAD in right dominant system and small distal disease, HLD, MVP, family history of premature CAD, anxiety, and atypical chest pain.   He was last seen 02/11/2018 by Dr. Eden Emms reporting atypical chest and back pain. Not worse with exertion. No murmur on exam so no echo ordered. A coronary CT with a calcium score was ordered which showed calcium score or 8 and nonobstructive CAD. At that time Dr. Eden Emms considered statin however decided to watch this.   Today, he presents for 2 year follow-up. He was unable to be seen last due to COVID.He reports he has been having intermittent chest pain for the last 2-3 months. It occurs about every other day. Generally at rest. It is very dull, normally 1/10 and last 15-20 seconds. Once the pain went up to a 3/10 and patient pulled over while driving, however pain quickly resolved. Pain is not worse with exertion. When he feels the pain he has no associated symptoms. Pain resolves on it's own. He has never taken aspirin or nitro when he had the pain. Patient is a Veterinary surgeon and generally has a very active lifestyle although does not to regular exercise. He does yard work and other physical jobs and does not report chest pain. He has had indigestion in the past and this is different than that. He is a nonsmoker. Has family history of heart attacks, most family members in their 63s. Patient has taken statins before and says they cause cognitive changes. Diet is relatively healthy. Given his busy lifestyle he eats out 3-4 times weekly. BP a little high tiday. Tommi Rumps does not takes Bps at home although will start. Says he ate a lot of  salt last night. Patient is vaccinated and has not had COVID.   He denies symptoms of palpitations, shortness of breath, orthopnea, PND, lower extremity edema, claudication, dizziness, presyncope, syncope, bleeding, or neurologic sequela. The patient is tolerating medications without difficulties and is otherwise without complaint today.  _____________   Past Medical History:  Diagnosis Date  . Anxiety   . GERD (gastroesophageal reflux disease)   . Heart murmur    MVP  . Hyperlipidemia   . Mitral valve prolapse 1976   Past Surgical History:  Procedure Laterality Date  . LASIK  1996  . MEDIAL PARTIAL KNEE REPLACEMENT Right 2016   in [place]  . MOUTH SURGERY    . VASECTOMY     _____________  No current outpatient medications on file.   No current facility-administered medications for this visit.   _____________   Allergies:   Sulfamethoxazole  _____________   Social History:  The patient  reports that he has never smoked. He has never used smokeless tobacco. He reports current alcohol use of about 1.0 standard drink of alcohol per week. He reports that he does not use drugs.  _____________   Family History:  The patient's family history includes Colon cancer in his father; Colon polyps in his father; Diabetes in his father and maternal uncle; Heart attack in his brother and maternal grandfather; Heart disease in his brother and maternal grandfather; Lymphoma  in his father; Other in his father and maternal uncle.  _____________   ROS:  Please see the history of present illness.   Positive for atypical chest pain,   All other systems are reviewed and negative.  _____________   PHYSICAL EXAM: VS:  There were no vitals taken for this visit. , BMI There is no height or weight on file to calculate BMI. GEN: Well nourished, well developed, in no acute distress  HEENT: normal  Neck: no JVD, carotid bruits, or masses Cardiac: RRR; no murmurs, rubs, or gallops. No clubbing, cyanosis,  edema.  Radials/DP/PT 2+ and equal bilaterally.  Respiratory:  clear to auscultation bilaterally, normal work of breathing GI: soft, nontender, nondistended, + BS MS: no deformity or atrophy  Skin: warm and dry, no rash Neuro:  Strength and sensation are intact Psych: euthymic mood, full affect _____________  EKG:   The ekg ordered today shows NSR, 66bpm, LAD  Recent Labs: No results found for requested labs within last 8760 hours.  No results found for requested labs within last 8760 hours.  CrCl cannot be calculated (Patient's most recent lab result is older than the maximum 21 days allowed.).  Wt Readings from Last 3 Encounters:  11/14/18 195 lb (88.5 kg)  10/27/18 195 lb (88.5 kg)  06/07/18 205 lb 8 oz (93.2 kg)    _____________   ASSESSMENT AND PLAN:  Atypical chest pain with history of nonobstructive CAD  He reports 2-3 months of intermittent atypical chest pain. Its' dull and not worse with exertion. CTA at [facility] 2014 showing isolated plaque in LAD with no significant CAD in right dominant system and small distal disease. In 2019 he had a calcium score of 8 and otherwise nonobstructive CAD per coronary CT. Given strong family history of pre-mature CAD and HLD will order Exercise stress Myoview test. Anxiety could also be plating a role. Also check general labs today since it's been 2 years. BP a little high today, says he had a lot of salt last night.  Recommended obtain BP cuff and check daily Bps. Check FLP, he reports statin intolerance. Discussed lifestyle changes.   H/o MVP At the last visit no murmur on exam so no echo ordered. No significant murmur on exam today  HLD Reports statin intolerance (cognitive changes) with multiple statins.  Last labs in 2019 chol 196, HDL 48, LDL 131, TG 84. Repeat labs.    Disposition:   FU with with Dr. Eden Emms in 1 month   Signed, Burma Ketcher David Stall, PA-C 08/21/2020 2:09 PM    _____________ [facility] [address] [place] [address]  [phone] (office) [phone] (fax)

## 2020-08-23 ENCOUNTER — Ambulatory Visit: Payer: 59 | Admitting: Medical

## 2020-08-23 ENCOUNTER — Encounter: Payer: Self-pay | Admitting: *Deleted

## 2020-08-23 ENCOUNTER — Other Ambulatory Visit: Payer: Self-pay

## 2020-08-23 ENCOUNTER — Encounter: Payer: Self-pay | Admitting: Medical

## 2020-08-23 VITALS — BP 140/70 | HR 66 | Ht 74.0 in | Wt 210.0 lb

## 2020-08-23 DIAGNOSIS — R0789 Other chest pain: Secondary | ICD-10-CM

## 2020-08-23 DIAGNOSIS — I25119 Atherosclerotic heart disease of native coronary artery with unspecified angina pectoris: Secondary | ICD-10-CM

## 2020-08-23 DIAGNOSIS — I2510 Atherosclerotic heart disease of native coronary artery without angina pectoris: Secondary | ICD-10-CM | POA: Insufficient documentation

## 2020-08-23 DIAGNOSIS — Z8679 Personal history of other diseases of the circulatory system: Secondary | ICD-10-CM

## 2020-08-23 DIAGNOSIS — E785 Hyperlipidemia, unspecified: Secondary | ICD-10-CM

## 2020-08-23 NOTE — Patient Instructions (Addendum)
Medication Instructions:  Your physician recommends that you continue on your current medications as directed. Please refer to the Current Medication list given to you today.  *If you need a refill on your cardiac medications before your next appointment, please call your pharmacy*   Lab Work: Tuesday morning, 08/28/20:  Come to the office for LIPID, CBC, & BMET.. you can come anytime that day after 7:30 a.m.  If you have labs (blood work) drawn today and your tests are completely normal, you will receive your results only by:  MyChart Message (if you have MyChart) OR  A paper copy in the mail If you have any lab test that is abnormal or we need to change your treatment, we will call you to review the results.   Testing/Procedures: Your physician has requested that you have an exercise tolerance test. For further information please visit [email or web address] Please also follow instruction sheet, BELOW:    Please arrive 15 minutes prior to your appointment time for registration and insurance purposes.  The test will take approximately 45 minutes to complete.  How to prepare for your Exercise Stress Test:  Do bring a list of your current medications with you.  If not listed below, you may take your medications as normal.  Do wear comfortable clothes (no dresses or overalls) and walking shoes, tennis shoes preferred (no heels or open toed shoes are allowed)  Do Not wear cologne, perfume, aftershave or lotions (deodorant is allowed).  Please report to [address] [facility], [address] for your test.  If these instructions are not followed, your test will have to be rescheduled.  If you have questions or concerns about your appointment, you can call the Stress Lab at [phone].      Follow-Up: At [facility], you and your health needs are our priority.  As part of our continuing mission to provide you with exceptional heart care, we have created designated Provider Care  Teams.  These Care Teams include your primary Cardiologist (physician) and Advanced Practice Providers (APPs -  Physician Assistants and Nurse Practitioners) who all work together to provide you with the care you need, when you need it.  We recommend signing up for the patient portal called "MyChart".  Sign up information is provided on this After Visit Summary.  MyChart is used to connect with patients for Virtual Visits (Telemedicine).  Patients are able to view lab/test results, encounter notes, upcoming appointments, etc.  Non-urgent messages can be sent to your provider as well.   To learn more about what you can do with MyChart, go to [email or web address].    Your next appointment:   Keep your scheduled appointment with Dr. Eden Emms   The format for your next appointment:   In Person  Provider:   Charlton Haws, MD   Other Instructions Get you a blood pressure monitor and keep a log of your blood pressures daily and bring them to your appointment when you see Dr. Eden Emms

## 2020-08-28 ENCOUNTER — Other Ambulatory Visit: Payer: Self-pay

## 2020-09-10 ENCOUNTER — Inpatient Hospital Stay (HOSPITAL_COMMUNITY): Admission: RE | Admit: 2020-09-10 | Payer: Self-pay | Source: Ambulatory Visit

## 2020-09-13 ENCOUNTER — Ambulatory Visit (HOSPITAL_COMMUNITY): Admission: RE | Admit: 2020-09-13 | Payer: Self-pay | Source: Ambulatory Visit | Attending: Medical | Admitting: Medical

## 2020-09-21 ENCOUNTER — Ambulatory Visit: Payer: Self-pay | Attending: Internal Medicine

## 2020-09-21 ENCOUNTER — Other Ambulatory Visit (HOSPITAL_BASED_OUTPATIENT_CLINIC_OR_DEPARTMENT_OTHER): Payer: Self-pay | Admitting: Internal Medicine

## 2020-09-21 DIAGNOSIS — Z23 Encounter for immunization: Secondary | ICD-10-CM

## 2020-09-21 MED FILL — FLUARIX QUADRIVALENT 0.5 ML: 0.5 | 1 days supply | Qty: 1 | Fill #0

## 2020-09-21 NOTE — Progress Notes (Signed)
   Covid-19 Vaccination Clinic  Name:  Nathan Ward    MRN: [id number] DOB: 1961-04-01  09/21/2020  Mr. Passe was observed post Covid-19 immunization for 15 minutes without incident. He was provided with Vaccine Information Sheet and instruction to access the V-Safe system.  Vaccinated by [facility]  Mr. Breighner was instructed to call 911 with any severe reactions post vaccine: Marland Kitchen Difficulty breathing  . Swelling of face and throat  . A fast heartbeat  . A bad rash all over body  . Dizziness and weakness

## 2020-09-25 ENCOUNTER — Ambulatory Visit: Payer: 59 | Admitting: Cardiovascular Disease

## 2020-09-28 ENCOUNTER — Telehealth (HOSPITAL_COMMUNITY): Payer: Self-pay | Admitting: *Deleted

## 2020-09-28 NOTE — Telephone Encounter (Signed)
Close encounter 

## 2020-09-29 ENCOUNTER — Other Ambulatory Visit (HOSPITAL_COMMUNITY)
Admission: RE | Admit: 2020-09-29 | Discharge: 2020-09-29 | Disposition: A | Discharge status: Home / Self Care | Payer: Commercial Managed Care - PPO | Source: Ambulatory Visit | Attending: Cardiovascular Disease | Admitting: Cardiovascular Disease

## 2020-09-29 DIAGNOSIS — Z20822 Contact with and (suspected) exposure to covid-19: Secondary | ICD-10-CM | POA: Diagnosis not present

## 2020-09-29 DIAGNOSIS — Z01812 Encounter for preprocedural laboratory examination: Secondary | ICD-10-CM | POA: Diagnosis present

## 2020-09-29 LAB — SARS CORONAVIRUS 2 (TAT 6-24 HRS): SARS Coronavirus 2: NEGATIVE

## 2020-10-01 MED FILL — PFIZER-BIONTECH COVID-19 VA: 30 | 1 days supply | Qty: 0 | Fill #0

## 2020-10-02 ENCOUNTER — Other Ambulatory Visit: Payer: Self-pay

## 2020-10-02 ENCOUNTER — Ambulatory Visit (HOSPITAL_COMMUNITY)
Admission: RE | Admit: 2020-10-02 | Discharge: 2020-10-02 | Disposition: A | Discharge status: Home / Self Care | Payer: Commercial Managed Care - PPO | Source: Ambulatory Visit | Attending: Cardiovascular Disease | Admitting: Cardiovascular Disease

## 2020-10-02 DIAGNOSIS — R0789 Other chest pain: Secondary | ICD-10-CM | POA: Diagnosis present

## 2020-10-02 DIAGNOSIS — Z8679 Personal history of other diseases of the circulatory system: Secondary | ICD-10-CM | POA: Diagnosis not present

## 2020-10-02 DIAGNOSIS — E785 Hyperlipidemia, unspecified: Secondary | ICD-10-CM

## 2020-10-02 LAB — CBC
Hematocrit: 44 % (ref 37.5–51.0)
Hemoglobin: 15.5 g/dL (ref 13.0–17.7)
MCH: 31.1 pg (ref 26.6–33.0)
MCHC: 35.2 g/dL (ref 31.5–35.7)
MCV: 88 fL (ref 79–97)
Platelets: 215 10*3/uL (ref 150–450)
RBC: 4.98 x10E6/uL (ref 4.14–5.80)
RDW: 12.5 % (ref 11.6–15.4)
WBC: 6.4 10*3/uL (ref 3.4–10.8)

## 2020-10-02 LAB — BASIC METABOLIC PANEL
BUN/Creatinine Ratio: 16 (ref 10–24)
BUN: 14 mg/dL (ref 8–27)
CO2: 23 mmol/L (ref 20–29)
Calcium: 9.5 mg/dL (ref 8.6–10.2)
Chloride: 105 mmol/L (ref 96–106)
Creatinine, Ser: 0.87 mg/dL (ref 0.76–1.27)
GFR calc Af Amer: 108 mL/min/{1.73_m2} (ref >59)
GFR calc non Af Amer: 93 mL/min/{1.73_m2} (ref >59)
Glucose: 96 mg/dL (ref 65–99)
Potassium: 4.9 mmol/L (ref 3.5–5.2)
Sodium: 142 mmol/L (ref 134–144)

## 2020-10-02 LAB — LIPID PANEL
Chol/HDL Ratio: 3.9 ratio (ref 0.0–5.0)
Cholesterol, Total: 236 mg/dL — ABNORMAL HIGH (ref 100–199)
HDL: 61 mg/dL (ref >39)
LDL Chol Calc (NIH): 163 mg/dL — ABNORMAL HIGH (ref 0–99)
Triglycerides: 71 mg/dL (ref 0–149)
VLDL Cholesterol Cal: 12 mg/dL (ref 5–40)

## 2020-10-02 LAB — EXERCISE TOLERANCE TEST
Estimated workload: 17.2 METS
Exercise duration (min): 14 min
Exercise duration (sec): 0 s
MPHR: 159 {beats}/min
Peak HR: 160 {beats}/min
Percent HR: 100 %
Rest HR: 58 {beats}/min

## 2020-10-02 NOTE — Progress Notes (Signed)
CARDIOLOGY CONSULT NOTE       Patient ID: Nathan Ward MRN: [id number] DOB/AGE: Nov 22, 1961 59 y.o.  Admit date: (Not on file) Referring Physician: Drue Novel Primary Physician: Wanda Plump, MD Primary Cardiologist: Eden Emms Reason for Consultation: Chest Pain  Active Problems:   * No active hospital problems. *   HPI:  59 y.o. 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 Anxiety   . GERD (gastroesophageal reflux disease)   . Heart murmur    MVP  . Hyperlipidemia   . Mitral valve prolapse 1976    Family History  Problem Relation Age of Onset  . Colon cancer Father        6  . Colon polyps Father   . Diabetes Father   . Other Father        acute myelogenous leukemia  . Lymphoma Father        non-Hodgkin's  . Heart disease Brother   . Heart attack Brother   . Diabetes Maternal Uncle   . Other Maternal Uncle        heart surgery: valve sx  . Heart disease Maternal Grandfather   . Heart attack Maternal Grandfather   . Prostate cancer Neg Hx     Social History   Socioeconomic History  . Marital status: Married    Spouse name: Not on file  .  Number of children: 3  . Years of education: Not on file  . Highest education level: Not on file  Occupational History  . Occupation: Engineer, manufacturing , Conservation officer, nature  . Occupation: finances   Tobacco Use  . Smoking status: Never Smoker  . Smokeless tobacco: Never Used  Vaping Use  . Vaping Use: Never used  Substance and Sexual Activity  . Alcohol use: Yes    Alcohol/week: 1.0 standard drink    Types: 1 Glasses of wine per week    Comment: occ  . Drug use: No  . Sexual activity: Yes  Other Topics Concern  . Not on file  Social History Narrative   Moved from [place] 2017   2nd marriage    Social Determinants of Health   Financial Resource Strain:   . Difficulty of Paying Living Expenses: Not on file  Food Insecurity:   . Worried About Programme researcher, broadcasting/film/video in the Last Year: Not on file  . Ran Out of Food in the Last Year: Not on file  Transportation Needs:   . Lack of Transportation (Medical): Not on file  . Lack of Transportation (Non-Medical): Not on file  Physical Activity:   . Days  of Exercise per Week: Not on file  . Minutes of Exercise per Session: Not on file  Stress:   . Feeling of Stress : Not on file  Social Connections:   . Frequency of Communication with Friends and Family: Not on file  . Frequency of Social Gatherings with Friends and Family: Not on file  . Attends Religious Services: Not on file  . Active Member of Clubs or Organizations: Not on file  . Attends Banker Meetings: Not on file  . Marital Status: Not on file  Intimate Partner Violence:   . Fear of Current or Ex-Partner: Not on file  . Emotionally Abused: Not on file  . Physically Abused: Not on file  . Sexually Abused: Not on file    Past Surgical History:  Procedure Laterality Date  . LASIK  1996  . MEDIAL PARTIAL KNEE REPLACEMENT Right 2016   in [place]  . MOUTH SURGERY    . VASECTOMY       No current outpatient medications on file.    Physical Exam: Blood pressure  112/70, pulse 60, height 6\' 2"  (1.88 m), weight 208 lb (94.3 kg), SpO2 98 %.    Affect appropriate Healthy:  appears stated age HEENT: normal Neck supple with no adenopathy JVP normal no bruits no thyromegaly Lungs clear with no wheezing and good diaphragmatic motion Heart:  S1/S2 no murmur, no rub, gallop or click PMI normal Abdomen: benighn, BS positve, no tenderness, no AAA no bruit.  No HSM or HJR Distal pulses intact with no bruits No edema Neuro non-focal Skin warm and dry No muscular weakness   Labs:   Lab Results  Component Value Date   WBC 6.4 10/02/2020   HGB 15.5 10/02/2020   HCT 44.0 10/02/2020   MCV 88 10/02/2020   PLT 215 10/02/2020    Recent Labs  Lab 10/02/20 1112  NA 142  K 4.9  CL 105  CO2 23  BUN 14  CREATININE 0.87  CALCIUM 9.5  GLUCOSE 96   No results found for: CKTOTAL, CKMB, CKMBINDEX, TROPONINI  Lab Results  Component Value Date   CHOL 236 (H) 10/02/2020   CHOL 196 10/27/2018   CHOL 199 06/21/2018   Lab Results  Component Value Date   HDL 61 10/02/2020   HDL 48.20 10/27/2018   HDL 54 06/21/2018   Lab Results  Component Value Date   LDLCALC 163 (H) 10/02/2020   LDLCALC 131 (H) 10/27/2018   LDLCALC 128 (H) 06/21/2018   Lab Results  Component Value Date   TRIG 71 10/02/2020   TRIG 84.0 10/27/2018   TRIG 86 06/21/2018   Lab Results  Component Value Date   CHOLHDL 3.9 10/02/2020   CHOLHDL 4 10/27/2018   CHOLHDL 3.7 06/21/2018   No results found for: LDLDIRECT    Radiology: Exercise Tolerance Test  Addendum Date: 10/02/2020    Excellent exercise capacity, achieved 17.2 METS  Normal BP response to exercise  Upsloping ST segment depression was noted during stress, however appeared more horizontal in leads III, aVF, V6. While horizontal ST depression suggestive of ischemia, low suspicion for significant ischemia given excellent exercise capacity, achieving 17 METS    Result Date: 10/02/2020  Excellent exercise  capacity, achieved 17.2 METS  Normal BP response to exercise  Upsloping ST segment depression was noted during stress, however appeared more horizontal in leads III, aVF, V6. While horizontal ST depression suggestive of ischemia, low suspicion for significant ischemia given excellent exercise capacity, achieving  17 METS     EKG: 08/23/20 SR normal    ASSESSMENT AND PLAN:   1. Chest Pain:  Family history, elevated lipids not on Rx non obstructive disease LAD/d1 cardiac CT 2019 normal ETT at 17 METS 10/02/20 observe 2. HLD:  Refer to lipid clinic favor starting PSK9 he can give himself shots and will check on pricing  3. MVP:  Historical diagnosis no murmur on exam observe 4. Anxiety improved f/u primary   F/U in a year F/U lipid clinic start ? PSK9  Signed: Charlton Haws 10/03/2020, 10:33 AM

## 2020-10-03 ENCOUNTER — Ambulatory Visit: Payer: Commercial Managed Care - PPO | Admitting: Cardiovascular Disease

## 2020-10-03 ENCOUNTER — Encounter: Payer: Self-pay | Admitting: Cardiovascular Disease

## 2020-10-03 VITALS — BP 112/70 | HR 60 | Ht 74.0 in | Wt 208.0 lb

## 2020-10-03 DIAGNOSIS — E785 Hyperlipidemia, unspecified: Secondary | ICD-10-CM

## 2020-10-03 DIAGNOSIS — I25119 Atherosclerotic heart disease of native coronary artery with unspecified angina pectoris: Secondary | ICD-10-CM

## 2020-10-03 NOTE — Patient Instructions (Addendum)
Medication Instructions:  *If you need a refill on your cardiac medications before your next appointment, please call your pharmacy*  Lab Work: If you have labs (blood work) drawn today and your tests are completely normal, you will receive your results only by: Marland Kitchen MyChart Message (if you have MyChart) OR . A paper copy in the mail If you have any lab test that is abnormal or we need to change your treatment, we will call you to review the results.  Testing/Procedures: None ordered today.   Follow-Up: At [facility], you and your health needs are our priority.  As part of our continuing mission to provide you with exceptional heart care, we have created designated Provider Care Teams.  These Care Teams include your primary Cardiologist (physician) and Advanced Practice Providers (APPs -  Physician Assistants and Nurse Practitioners) who all work together to provide you with the care you need, when you need it.  We recommend signing up for the patient portal called "MyChart".  Sign up information is provided on this After Visit Summary.  MyChart is used to connect with patients for Virtual Visits (Telemedicine).  Patients are able to view lab/test results, encounter notes, upcoming appointments, etc.  Non-urgent messages can be sent to your provider as well.   To learn more about what you can do with MyChart, go to [email or web address].    Your next appointment:   12 month(s)  The format for your next appointment:   In Person  Provider:   You may see Dr. Eden Emms or one of the following Advanced Practice Providers on your designated Care Team:    Norma Fredrickson, NP  Nada Boozer, NP  Georgie Chard, NP  You have been referred to Lipid Clinic.

## 2020-10-23 NOTE — Progress Notes (Unsigned)
Patient ID: JIMI SCHAPPERT                 DOB: 17-Jun-1961                    MRN: [id number]     HPI: CHAS AXEL is a 59 y.o. male patient referred to lipid clinic by Dr. Eden Emms. PMH is significant for  anxiety, MVP, HLD. Cardiac CTA 2014 with calcium score 2.4 plaques in LAD Worried about family history of Lpa stroke and MI F/U cardiac CTA 03/15/18 with calcium score 8, 43rd percentile. Aortic root 4.1 cm and non obstructive disease in distal LAD and D1.   Seen by PA 08/23/20 complained of intermittent SSCP for 2-3 months lasting seconds and at rest (UA?)***.   Patient presents today. He does not like statins as he has been on them in past and had cognitive changes.   PCSK-9?  Another statin? Simvastatin and atorvastatin can cause more memory changes  Nexlizet and Repatha  tier 2 (copay?)  Current Medications: none Intolerances: atorvastatin 10 mg daily (cognitive changes) Risk Factors: nonobstructive CAD, progressive CAD (unstable angina) LDL goal: <70 mg/dL  Diet:   Exercise:   Family History: Colon cancer in his father, diabetes in his father, lymphoma in his father, heart disease in his brother and maternal grandfather.  Social History: Never smoker. Reports he does not use drugs  Labs: 10/02/20 TC 236 TG 71 HDL 61 LDL 163 without medication  Past Medical History:  Diagnosis Date  . Anxiety   . GERD (gastroesophageal reflux disease)   . Heart murmur    MVP  . Hyperlipidemia   . Mitral valve prolapse 1976    No current outpatient medications on file prior to visit.   No current facility-administered medications on file prior to visit.    Allergies  Allergen Reactions  . Sulfamethoxazole Hives    Assessment/Plan:  1. Hyperlipidemia - Patient presents with LDL above goal of <55 mg/dL.

## 2020-10-24 ENCOUNTER — Ambulatory Visit: Payer: Commercial Managed Care - PPO

## 2020-11-05 ENCOUNTER — Other Ambulatory Visit: Payer: Commercial Managed Care - PPO

## 2020-11-17 NOTE — Progress Notes (Unsigned)
Patient ID: Nathan Ward                 DOB: [birth year]                    MRN: [id number]     HPI: Nathan Ward is a 59 y.o. male patient referred to lipid clinic by Dr. Eden Emms. 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 Last seen by Dr. Eden Emms on 10/03/20 and had cognitive issues with atorvastatin and did not want to start another statin. Discussed PSCK9-inhibitor and/or Nexlitol for lipid management.   Patient presents today in good spirits. Reports ***  Compliance? Muscle pain? Meds tried in the past and any side effects? Go over labs and goals Lipid therapy options -PCSK9-inhibitor - repatha - $5 copay card Diet?? Exercise??   Insurance coverage?  Order Lab F/u appt in 3 months (lipid panel and LFTs)   Current Medications: none Intolerances: atorvastatin 10 mg daily (cognitive issues) Risk Factors: CAD w/calcium score of 8 (43rd percentile), HLD LDL goal: <70 mg/dL  Diet:   Exercise:   Family History: Diabetes in father; heart disease and heart attack in brother and grandfather   Social History: denies tobacco abuse; 1 Glasses of wine per week  Labs: 10/02/20: LDL 163, TC 236, TG 71, HDL 71 (no therapy)  Past Medical History:  Diagnosis Date  . Anxiety   . GERD (gastroesophageal reflux disease)   . Heart murmur    MVP  . Hyperlipidemia   . Mitral valve prolapse 1976    No current outpatient medications on file prior to visit.   No current facility-administered medications on file prior to visit.    Allergies  Allergen Reactions  . Sulfamethoxazole Hives    Assessment/Plan:  1. Hyperlipidemia -   Fabio Neighbors, PharmD, BCPS PGY2 Ambulatory Care Pharmacy Resident [facility] [address] [place], [place] [address] Phone: [phone]; Fax: [phone]

## 2020-11-20 ENCOUNTER — Encounter: Payer: Self-pay | Admitting: Internal Medicine

## 2020-11-20 ENCOUNTER — Ambulatory Visit: Payer: Commercial Managed Care - PPO

## 2020-11-20 ENCOUNTER — Other Ambulatory Visit: Payer: Self-pay

## 2020-11-20 ENCOUNTER — Ambulatory Visit (INDEPENDENT_AMBULATORY_CARE_PROVIDER_SITE_OTHER): Payer: Commercial Managed Care - PPO | Admitting: Internal Medicine

## 2020-11-20 VITALS — BP 134/78 | HR 63 | Temp 97.9°F | Ht 74.0 in | Wt 210.0 lb

## 2020-11-20 DIAGNOSIS — Z0000 Encounter for general adult medical examination without abnormal findings: Secondary | ICD-10-CM | POA: Diagnosis not present

## 2020-11-20 NOTE — Patient Instructions (Addendum)
   GO TO THE LAB : Get the blood work     GO TO THE FRONT DESK, PLEASE SCHEDULE YOUR APPOINTMENTS Come back for physical exam in 1 year 

## 2020-11-20 NOTE — Progress Notes (Signed)
   Subjective:    Patient ID: Nathan Ward, male    DOB: Feb 01, 1961, 59 y.o.   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   Review of Systems  Other than above, a 14 point review of systems is negative      Past Medical History:  Diagnosis Date  . Anxiety   . GERD (gastroesophageal reflux disease)   . Heart murmur    MVP  . Hyperlipidemia   . Mitral valve prolapse 1976    Past Surgical History:  Procedure Laterality Date  . LASIK  1996  . MEDIAL PARTIAL KNEE REPLACEMENT Right 2016   in [place]  . MOUTH SURGERY    . VASECTOMY      Allergies as of 11/20/2020      Reactions   Sulfamethoxazole Hives      Medication List    as of November 20, 2020 11:59 PM   You have not been prescribed any medications.        Objective:   Physical Exam BP 134/78 (BP Location: Right Arm, Patient Position: Sitting, Cuff Size: Large)   Pulse 63   Temp 97.9 F (36.6 C) (Oral)   Ht 6\' 2"  (1.88 m)   Wt 210 lb (95.3 kg)   BMI 26.96 kg/m  General: Well developed, NAD, BMI noted Neck: No  thyromegaly  HEENT:  Normocephalic . Face symmetric, atraumatic Lungs:  CTA B Normal respiratory effort, no intercostal retractions, no accessory muscle use. Heart: RRR,  no murmur.  Abdomen:  Not distended, soft, non-tender. No rebound or rigidity.  DRE: Normal sphincter tone, normal prostate.  No stools.    Lower extremities: no pretibial edema bilaterally  Skin: Exposed areas without rash. Not pale. Not jaundice Neurologic:  alert & oriented X3.  Speech normal, gait appropriate for age and unassisted Strength symmetric and appropriate for age.  Psych: Cognition and judgment appear intact.  Cooperative with normal attention span and concentration.  Behavior appropriate. No anxious or depressed appearing.     Assessment      Assessment  (new patient, 04/2018,  referred by Dr. 05/2018) Heart murmur, MVP dx 1976 per pt  Hyperlipidemia Anxiety GERD FH CAD 03/2018: CT  Ca+ coronary  score: average for age, no obstructive CAD.RX: CV RF control ETT (-) 09/2020  PLAN Here for CPX Heart murmur, FH CAD: Saw cardiology 10/03/2020: At some point he had chest pain, ETT 10/02/2020 was normal. They discussed the statins and patient declined, was referred to the lipid clinic for consideration of PSK 9. RTC 1 year   This visit occurred during the SARS-CoV-2 public health emergency.  Safety protocols were in place, including screening questions prior to the visit, additional usage of staff PPE, and extensive cleaning of exam room while observing appropriate contact time as indicated for disinfecting solutions.

## 2020-11-21 ENCOUNTER — Other Ambulatory Visit: Payer: Self-pay

## 2020-11-21 ENCOUNTER — Encounter: Payer: Self-pay | Admitting: Internal Medicine

## 2020-11-21 ENCOUNTER — Ambulatory Visit (INDEPENDENT_AMBULATORY_CARE_PROVIDER_SITE_OTHER): Payer: Commercial Managed Care - PPO | Admitting: Pharmacist

## 2020-11-21 DIAGNOSIS — E785 Hyperlipidemia, unspecified: Secondary | ICD-10-CM | POA: Diagnosis not present

## 2020-11-21 LAB — HEPATIC FUNCTION PANEL
ALT: 21 U/L (ref 0–53)
AST: 19 U/L (ref 0–37)
Albumin: 4.4 g/dL (ref 3.5–5.2)
Alkaline Phosphatase: 51 U/L (ref 39–117)
Bilirubin, Direct: 0.1 mg/dL (ref 0.0–0.3)
Total Bilirubin: 0.7 mg/dL (ref 0.2–1.2)
Total Protein: 6.5 g/dL (ref 6.0–8.3)

## 2020-11-21 LAB — PSA: PSA: 1.39 ng/mL (ref 0.10–4.00)

## 2020-11-21 LAB — HEMOGLOBIN A1C: Hgb A1c MFr Bld: 5.2 % (ref 4.6–6.5)

## 2020-11-21 MED ORDER — EZETIMIBE 10 MG PO TABS: 10.0000 mg | ORAL_TABLET | Freq: Every day | ORAL | 11 refills | Status: DC

## 2020-11-21 NOTE — Progress Notes (Signed)
Patient ID: Nathan Ward                 DOB: 06-07-1961                    MRN: [id number]     HPI: Nathan Ward is a 59 y.o. male patient referred to lipid clinic by Dr. Eden Ward. PMH is significant for anxiety, MVP, HLD Cardiac CTA in 2014 with calcium score 2.4 -plaque in LAD. 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 He has had issues with statins in the past causing cognitive changes. Referred to lipid clinic to discuss PCSK9i.  Patient presents today to lipid clinic. Upon review of his insurance it appears they require a trial of ezetimibe first.   Current Medications: none Intolerances: atorvastatin 10mg  daily Risk Factors: CAD LDL goal: <70  Diet: breakfast: coffee w/ toast, muffin, bagel, and PB Lunch: soup or sandwich Dinner: omlette w/ veggies, potatoes, chicken or fish Avoids fried foods Whole foods- nuts, fruit  Exercise: rides bike  Family History:  Family History  Problem Relation Age of Onset  . Colon cancer Father        49  . Colon polyps Father   . Diabetes Father   . Other Father        acute myelogenous leukemia  . Lymphoma Father        non-Hodgkin's  . Heart disease Brother   . Heart attack Brother   . Diabetes Maternal Uncle   . Other Maternal Uncle        heart surgery: valve sx  . Heart disease Maternal Grandfather   . Heart attack Maternal Grandfather   . Prostate cancer Neg Hx      Social History: never smoked, + occasional ETOH, no illicit drugs  Labs:10/02/20 TC 236, TG 71, HDL 61, LDL 163  Past Medical History:  Diagnosis Date  . Anxiety   . GERD (gastroesophageal reflux disease)   . Heart murmur    MVP  . Hyperlipidemia   . Mitral valve prolapse 1976    No current outpatient medications on file prior to visit.   No current facility-administered medications on  file prior to visit.    Allergies  Allergen Reactions  . Sulfamethoxazole Hives    Assessment/Plan:  1. Hyperlipidemia - LDL is above goal of <70. Insurance require trial of ezetimbie before they will pay for PCSK9i. He has cognitive issues with atorvastatin. Will try ezetimibe for 8 weeks, then repeat labs. Patient was educated on side effects of ezetimibe. We also reviewed injection technique of PCSK9i. Lipid labs on 01/16/21. Diet and exercise were reviewed in detail. Patient encouraged to limit sugars, breads and pastas and incorporate more aerobic exercise.   Thank you,   03/16/21, Pharm.D, BCPS, CPP [facility]  [address] [place], [place]  Phone: [phone]; Fax: [phone]

## 2020-11-21 NOTE — Patient Instructions (Signed)
Start taking zetia 10mg  once a day.  Labs on 01/16/21 anytime after 7:30AM  Call me at [phone] with any questions

## 2020-11-21 NOTE — Assessment & Plan Note (Signed)
-   Td 05/2018 -Completed Shingrix -Had COVID vaccines x3 - had a Flu shot - CCS: Had 2 colonoscopy, last one was December 2017 in [place], had 4 polyps, was recommended 5 years. - Prostate cancer screening: DRE normal, check a PSA.  No symptoms.    - Labs: LFTs, A1c, PSA -Lifestyle: Reports she is very active and eats healthy.

## 2020-11-21 NOTE — Assessment & Plan Note (Signed)
Here for CPX Heart murmur, FH CAD: Saw cardiology 10/03/2020: At some point he had chest pain, ETT 10/02/2020 was normal. They discussed the statins and patient declined, was referred to the lipid clinic for consideration of PSK 9. RTC 1 year

## 2021-01-16 ENCOUNTER — Other Ambulatory Visit: Payer: Commercial Managed Care - PPO

## 2021-01-16 ENCOUNTER — Other Ambulatory Visit: Payer: Self-pay

## 2021-01-16 DIAGNOSIS — E785 Hyperlipidemia, unspecified: Secondary | ICD-10-CM

## 2021-01-17 LAB — LIPID PANEL
Chol/HDL Ratio: 3.8 ratio (ref 0.0–5.0)
Cholesterol, Total: 178 mg/dL (ref 100–199)
HDL: 47 mg/dL (ref >39)
LDL Chol Calc (NIH): 113 mg/dL — ABNORMAL HIGH (ref 0–99)
Triglycerides: 98 mg/dL (ref 0–149)
VLDL Cholesterol Cal: 18 mg/dL (ref 5–40)

## 2021-01-21 ENCOUNTER — Telehealth: Payer: Self-pay | Admitting: Pharmacist

## 2021-01-21 NOTE — Telephone Encounter (Signed)
Repatha approved through 07/17/21. Called pt to let him know it was approved, confirm that he would still like to proceed with medication and find out what pharmacy he would like it sent to.  LVM for patient to call back Copay card activated  BIN [id number] PCN CN GRP [id number] ID [id number]

## 2021-01-23 MED ORDER — REPATHA SURECLICK 140 MG/ML ~~LOC~~ SOAJ: 1.0000 "pen " | SUBCUTANEOUS | 11 refills | Status: DC

## 2021-01-23 NOTE — Telephone Encounter (Signed)
Spoke with patient. He would like to proceed with the Repatha. Feels comfortable with the injections. Rx sent to [facility]. Copay card info called into [facility]. Will repeat lipids in 3 months.

## 2021-03-05 ENCOUNTER — Encounter: Payer: Self-pay | Admitting: Internal Medicine

## 2021-04-05 ENCOUNTER — Ambulatory Visit: Payer: Commercial Managed Care - PPO | Attending: Internal Medicine

## 2021-04-05 ENCOUNTER — Other Ambulatory Visit (HOSPITAL_BASED_OUTPATIENT_CLINIC_OR_DEPARTMENT_OTHER): Payer: Self-pay

## 2021-04-05 DIAGNOSIS — Z23 Encounter for immunization: Secondary | ICD-10-CM

## 2021-04-05 MED ORDER — MODERNA COVID-19 VACCINE 100 MCG/0.5ML IM SUSP
INTRAMUSCULAR | 0 refills | Status: DC
  Filled 2021-04-05: qty 0.3, 1d supply, fill #0

## 2021-04-18 ENCOUNTER — Telehealth: Payer: Self-pay

## 2021-04-18 DIAGNOSIS — E785 Hyperlipidemia, unspecified: Secondary | ICD-10-CM

## 2021-04-18 NOTE — Telephone Encounter (Signed)
-----   Message from Olene Floss, RPH-CPP sent at 04/18/2021  7:35 AM EDT -----  ----- Message ----- From: Olene Floss, RPH-CPP Sent: 04/17/2021  12:00 AM EDT To: Olene Floss, RPH-CPP  Set up lipids

## 2021-04-18 NOTE — Telephone Encounter (Signed)
Called to schedule lipid and hepatic panel but the pt said he would have to check his schedule and call us back and that he is not sure if he wants to do them at [facility] or [facility] and due to the differences for how we order labs I didn't order them yet because that will depend on where he wants to go

## 2021-05-20 ENCOUNTER — Telehealth: Payer: Self-pay

## 2021-05-20 NOTE — Telephone Encounter (Signed)
Called and spoke with pt who stated that they are in the process of moving and they said they can't see any scenario where they would be able to come in anytime in the near future for labs.

## 2021-05-20 NOTE — Telephone Encounter (Signed)
-----   Message from Olene Floss, RPH-CPP sent at 05/20/2021  8:14 AM EDT ----- Please call again. thanks ----- Message ----- From: Eather Colas, CMA Sent: 04/18/2021   8:48 AM EDT To: Olene Floss, RPH-CPP  Called to schedule lipid and hepatic panel but the pt said he would have to check his schedule and call us back and that he is not sure if he wants to do them at [facility] or [facility] and due to the differences for how we order labs I didn't order them yet because that will depend on where he wants to go ----- Message ----- From: Olene Floss, RPH-CPP Sent: 04/18/2021   7:36 AM EDT To: Eather Colas, CMA   ----- Message ----- From: Olene Floss, RPH-CPP Sent: 04/17/2021  12:00 AM EDT To: Olene Floss, RPH-CPP  Set up lipids

## 2021-06-27 ENCOUNTER — Telehealth: Payer: Self-pay

## 2021-06-27 DIAGNOSIS — E785 Hyperlipidemia, unspecified: Secondary | ICD-10-CM

## 2021-06-27 DIAGNOSIS — I341 Nonrheumatic mitral (valve) prolapse: Secondary | ICD-10-CM

## 2021-06-27 NOTE — Telephone Encounter (Signed)
-----   Message from Olene Floss, RPH-CPP sent at 06/27/2021  2:00 PM EDT ----- Please ask pt to come for labs. We wont be able to get his Repatha PA approved without new labs

## 2021-06-27 NOTE — Telephone Encounter (Signed)
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

## 2021-07-01 ENCOUNTER — Telehealth: Payer: Self-pay | Admitting: Pharmacist

## 2021-07-01 NOTE — Telephone Encounter (Signed)
MD called back, stated labs on Repatha will need to be faxed back to insurance company. Pt has already been called and advised he needs to come for labs after starting Repatha.

## 2021-07-01 NOTE — Telephone Encounter (Signed)
Dr Paulette Blanch left message regarding peer to peer review for Repatha, call back #[phone].  Called back and left message. Looks like pt just needs to have labs drawn after starting Repatha for it to be reauthorized, unclear why peer to peer review is needed.

## 2021-09-09 ENCOUNTER — Ambulatory Visit: Payer: Commercial Managed Care - PPO | Attending: Internal Medicine

## 2021-09-09 ENCOUNTER — Other Ambulatory Visit (HOSPITAL_BASED_OUTPATIENT_CLINIC_OR_DEPARTMENT_OTHER): Payer: Self-pay

## 2021-09-09 DIAGNOSIS — Z23 Encounter for immunization: Secondary | ICD-10-CM

## 2021-09-09 MED ORDER — INFLUENZA VAC SPLIT QUAD 0.5 ML IM SUSY
PREFILLED_SYRINGE | INTRAMUSCULAR | 0 refills | Status: DC
  Filled 2021-09-09: qty 0.5, 1d supply, fill #0

## 2021-09-09 NOTE — Progress Notes (Signed)
   Covid-19 Vaccination Clinic  Name:  Nathan Ward    MRN: [id number] DOB: 1961-07-10  09/09/2021  [name] was observed post Covid-19 immunization for 15 minutes without incident. He was provided with Vaccine Information Sheet and instruction to access the V-Safe system.   [name] was instructed to call 911 with any severe reactions post vaccine: Difficulty breathing  Swelling of face and throat  A fast heartbeat  A bad rash all over body  Dizziness and weakness

## 2021-09-17 ENCOUNTER — Other Ambulatory Visit (HOSPITAL_BASED_OUTPATIENT_CLINIC_OR_DEPARTMENT_OTHER): Payer: Self-pay

## 2021-09-17 MED ORDER — COVID-19MRNA BIVAL VACC PFIZER 30 MCG/0.3ML IM SUSP
INTRAMUSCULAR | 0 refills | Status: DC
  Filled 2021-09-17: qty 0.3, 1d supply, fill #0

## 2021-10-13 ENCOUNTER — Telehealth: Payer: Commercial Managed Care - PPO | Admitting: Family

## 2021-10-13 DIAGNOSIS — R6889 Other general symptoms and signs: Secondary | ICD-10-CM

## 2021-10-13 MED ORDER — FLUTICASONE PROPIONATE 50 MCG/ACT NA SUSP
2.0000 | Freq: Every day | NASAL | 6 refills | Status: DC
Start: 2021-10-13 — End: 2023-01-01

## 2021-10-13 MED ORDER — ONDANSETRON HCL 4 MG PO TABS: 4.0000 mg | ORAL_TABLET | Freq: Three times a day (TID) | ORAL | 0 refills | Status: DC | PRN

## 2021-10-13 MED ORDER — OSELTAMIVIR PHOSPHATE 75 MG PO CAPS: 75.0000 mg | ORAL_CAPSULE | Freq: Two times a day (BID) | ORAL | 0 refills | Status: DC

## 2021-10-13 NOTE — Patient Instructions (Signed)
Influenza, Adult °Influenza, also called "the flu," is a viral infection that mainly affects the respiratory tract. This includes the lungs, nose, and throat. The flu spreads easily from person to person (is contagious). It causes common cold symptoms, along with high fever and body aches. °What are the causes? °This condition is caused by the influenza virus. You can get the virus by: °Breathing in droplets that are in the air from an infected person's cough or sneeze. °Touching something that has the virus on it (has been contaminated) and then touching your mouth, nose, or eyes. °What increases the risk? °The following factors may make you more likely to get the flu: °Not washing or sanitizing your hands often. °Having close contact with many people during cold and flu season. °Touching your mouth, eyes, or nose without first washing or sanitizing your hands. °Not getting an annual flu shot. °You may have a higher risk for the flu, including serious problems, such as a lung infection (pneumonia), if you: °Are older than 65. °Are pregnant. °Have a weakened disease-fighting system (immune system). This includes people who have HIV or AIDS, are on chemotherapy, or are taking medicines that reduce (suppress) the immune system. °Have a long-term (chronic) illness, such as heart disease, kidney disease, diabetes, or lung disease. °Have a liver disorder. °Are severely overweight (morbidly obese). °Have anemia. °Have asthma. °What are the signs or symptoms? °Symptoms of this condition usually begin suddenly and last 4-14 days. These may include: °Fever and chills. °Headaches, body aches, or muscle aches. °Sore throat. °Cough. °Runny or stuffy (congested) nose. °Chest discomfort. °Poor appetite. °Weakness or fatigue. °Dizziness. °Nausea or vomiting. °How is this diagnosed? °This condition may be diagnosed based on: °Your symptoms and medical history. °A physical exam. °Swabbing your nose or throat and testing the fluid  for the influenza virus. °How is this treated? °If the flu is diagnosed early, you can be treated with antiviral medicine that is given by mouth (orally) or through an IV. This can help reduce how severe the illness is and how long it lasts. °Taking care of yourself at home can help relieve symptoms. Your health care provider may recommend: °Taking over-the-counter medicines. °Drinking plenty of fluids. °In many cases, the flu goes away on its own. If you have severe symptoms or complications, you may be treated in a hospital. °Follow these instructions at home: °Activity °Rest as needed and get plenty of sleep. °Stay home from work or school as told by your health care provider. Unless you are visiting your health care provider, avoid leaving home until your fever has been gone for 24 hours without taking medicine. °Eating and drinking °Take an oral rehydration solution (ORS). This is a drink that is sold at pharmacies and retail stores. °Drink enough fluid to keep your urine pale yellow. °Drink clear fluids in small amounts as you are able. Clear fluids include water, ice chips, fruit juice mixed with water, and low-calorie sports drinks. °Eat bland, easy-to-digest foods in small amounts as you are able. These foods include bananas, applesauce, rice, lean meats, toast, and crackers. °Avoid drinking fluids that contain a lot of sugar or caffeine, such as energy drinks, regular sports drinks, and soda. °Avoid alcohol. °Avoid spicy or fatty foods. °General instructions °  °Take over-the-counter and prescription medicines only as told by your health care provider. °Use a cool mist humidifier to add humidity to the air in your home. This can make it easier to breathe. °When using a cool mist humidifier,   clean it daily. Empty the water and replace it with clean water. °Cover your mouth and nose when you cough or sneeze. °Wash your hands with soap and water often and for at least 20 seconds, especially after you cough or  sneeze. If soap and water are not available, use alcohol-based hand sanitizer. °Keep all follow-up visits. This is important. °How is this prevented? ° °Get an annual flu shot. This is usually available in late summer, fall, or winter. Ask your health care provider when you should get your flu shot. °Avoid contact with people who are sick during cold and flu season. This is generally fall and winter. °Contact a health care provider if: °You develop new symptoms. °You have: °Chest pain. °Diarrhea. °A fever. °Your cough gets worse. °You produce more mucus. °You feel nauseous or you vomit. °Get help right away if you: °Develop shortness of breath or have difficulty breathing. °Have skin or nails that turn a bluish color. °Have severe pain or stiffness in your neck. °Develop a sudden headache or sudden pain in your face or ear. °Cannot eat or drink without vomiting. °These symptoms may represent a serious problem that is an emergency. Do not wait to see if the symptoms will go away. Get medical help right away. Call your local emergency services (911 in the [place] Do not drive yourself to the hospital. °Summary °Influenza, also called "the flu," is a viral infection that primarily affects your respiratory tract. °Symptoms of the flu usually begin suddenly and last 4-14 days. °Getting an annual flu shot is the best way to prevent getting the flu. °Stay home from work or school as told by your health care provider. Unless you are visiting your health care provider, avoid leaving home until your fever has been gone for 24 hours without taking medicine. °Keep all follow-up visits. This is important. °This information is not intended to replace advice given to you by your health care provider. Make sure you discuss any questions you have with your health care provider. °Document Revised: 07/13/2020 Document Reviewed: 07/13/2020 °Elsevier Patient Education © 2022 [employer] Inc. ° °

## 2021-10-13 NOTE — Progress Notes (Signed)
Virtual Visit Consent   Nathan Ward, you are scheduled for a virtual visit with a [facility] provider today.     Just as with appointments in the office, your consent must be obtained to participate.  Your consent will be active for this visit and any virtual visit you may have with one of our providers in the next 365 days.     If you have a MyChart account, a copy of this consent can be sent to you electronically.  All virtual visits are billed to your insurance company just like a traditional visit in the office.    As this is a virtual visit, video technology does not allow for your provider to perform a traditional examination.  This may limit your provider's ability to fully assess your condition.  If your provider identifies any concerns that need to be evaluated in person or the need to arrange testing (such as labs, EKG, etc.), we will make arrangements to do so.     Although advances in technology are sophisticated, we cannot ensure that it will always work on either your end or our end.  If the connection with a video visit is poor, the visit may have to be switched to a telephone visit.  With either a video or telephone visit, we are not always able to ensure that we have a secure connection.     I need to obtain your verbal consent now.   Are you willing to proceed with your visit today?    Nathan Ward has provided verbal consent on 10/13/2021 for a virtual visit (video or telephone).   Nathan Rodney, FNP   Date: 10/13/2021 12:16 PM   Virtual Visit via Video Note   I, Nathan Ward, connected with  Nathan Ward  ([id number], 15-Jul-1961) on 10/13/21 at 12:15 PM EST by a video-enabled telemedicine application and verified that I am speaking with the correct person using two identifiers.  Location: Patient: Virtual Visit Location Patient: Home Provider: Virtual Visit Location Provider: Mobile   I discussed the limitations of evaluation and management by telemedicine and the  availability of in person appointments. The patient expressed understanding and agreed to proceed.    History of Present Illness: Nathan Ward is a 60 y.o. who identifies as a male who was assigned male at birth, and is being seen today for headache, cough and fever that started yesterday. His wife was diagnosed with flu last week and was COVID negative.    HPI: Cough This is a new problem. The current episode started yesterday. The problem has been waxing and waning. The problem occurs every few minutes. The cough is Non-productive. Associated symptoms include chills, a fever (102.6 F), headaches, myalgias, nasal congestion, postnasal drip and a sore throat. Pertinent negatives include no ear pain, shortness of breath or wheezing. Associated symptoms comments: sneezing. He has tried rest and OTC cough suppressant (tylenol) for the symptoms.   Problems:  Patient Active Problem List   Diagnosis Date Noted   CAD (coronary artery disease) nonobstructive 08/23/2020   Atypical chest pain 08/23/2020   Annual physical exam 10/27/2018   PCP NOTES >>>>>>>> 04/08/2018   Mitral valve prolapse 04/06/2018   Hyperlipidemia 04/06/2018   Anxiety 04/06/2018   Laryngopharyngeal reflux (LPR) 03/10/2017   OA (osteoarthritis) of knee 12/21/2015   Primary osteoarthritis of right knee 12/21/2015    Allergies:  Allergies  Allergen Reactions   Sulfamethoxazole Hives   Medications:  Current Outpatient Medications:  fluticasone (FLONASE) 50 MCG/ACT nasal spray, Place 2 sprays into both nostrils daily., Disp: 16 g, Rfl: 6   ondansetron (ZOFRAN) 4 MG tablet, Take 1 tablet (4 mg total) by mouth every 8 (eight) hours as needed for nausea or vomiting., Disp: 20 tablet, Rfl: 0   oseltamivir (TAMIFLU) 75 MG capsule, Take 1 capsule (75 mg total) by mouth 2 (two) times daily., Disp: 10 capsule, Rfl: 0  Observations/Objective: Patient is well-developed, well-nourished in no acute distress.  Resting comfortably  at  home.  Head is normocephalic, atraumatic.  No labored breathing.  Speech is clear and coherent with logical content.  Patient is alert and oriented at baseline.  Laying on cough, intermittent dry cough  Assessment and Plan: 1. Flu-like symptoms - oseltamivir (TAMIFLU) 75 MG capsule; Take 1 capsule (75 mg total) by mouth 2 (two) times daily.  Dispense: 10 capsule; Refill: 0 - fluticasone (FLONASE) 50 MCG/ACT nasal spray; Place 2 sprays into both nostrils daily.  Dispense: 16 g; Refill: 6 - ondansetron (ZOFRAN) 4 MG tablet; Take 1 tablet (4 mg total) by mouth every 8 (eight) hours as needed for nausea or vomiting.  Dispense: 20 tablet; Refill: 0 Force fluids Tylenol  Rest   Follow Up Instructions: I discussed the assessment and treatment plan with the patient. The patient was provided an opportunity to ask questions and all were answered. The patient agreed with the plan and demonstrated an understanding of the instructions.  A copy of instructions were sent to the patient via MyChart unless otherwise noted below.     The patient was advised to call back or seek an in-person evaluation if the symptoms worsen or if the condition fails to improve as anticipated.  Time:  I spent 7 minutes with the patient via telehealth technology discussing the above problems/concerns.    Nathan Rodney, FNP

## 2022-02-07 ENCOUNTER — Encounter: Payer: Self-pay | Admitting: Internal Medicine

## 2022-08-29 ENCOUNTER — Other Ambulatory Visit (HOSPITAL_BASED_OUTPATIENT_CLINIC_OR_DEPARTMENT_OTHER): Payer: Self-pay

## 2022-08-29 MED ORDER — FLUARIX QUADRIVALENT 0.5 ML IM SUSY
PREFILLED_SYRINGE | INTRAMUSCULAR | 0 refills | Status: DC
  Filled 2022-08-29: qty 0.5, 1d supply, fill #0

## 2022-12-29 NOTE — Progress Notes (Signed)
CARDIOLOGY CONSULT NOTE       Patient ID: Nathan Ward MRN: [id number] DOB/AGE: 64-30-1962 64 y.o.  Admit date: (Not on file) Referring Physician: Larose Kells Primary Physician: Colon Branch, MD Primary Cardiologist: Johnsie Cancel   HPI:  62 y.o. 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 One grand daughter   Willing to start Rx for cholesterol Wife has "alzheimer's" gene and also needs to be on non statin drug  ROS All other systems reviewed and negative except as noted above  Past Medical History:  Diagnosis Date   Anxiety    GERD (gastroesophageal reflux disease)    Heart murmur    MVP   Hyperlipidemia    Mitral valve prolapse 1976    Family History  Problem Relation Age of Onset   Colon cancer Father        75   Colon polyps Father    Diabetes Father    Other Father        acute myelogenous leukemia   Lymphoma Father        non-Hodgkin's   Heart disease Brother    Heart attack Brother    Diabetes Maternal Uncle    Other Maternal Uncle        heart surgery: valve sx   Heart disease Maternal Grandfather     Heart attack Maternal Grandfather    Prostate cancer Neg Hx     Social History   Socioeconomic History   Marital status: Married    Spouse name: Not on file   Number of children: 3   Years of education: Not on file   Highest education level: Not on file  Occupational History   Occupation: real state   Tobacco Use   Smoking status: Never   Smokeless tobacco: Never  Vaping Use   Vaping Use: Never used  Substance and Sexual Activity   Alcohol use: Yes    Alcohol/week: 1.0 standard drink of alcohol    Types: 1 Glasses of wine per week    Comment: occ   Drug use: No   Sexual activity: Yes  Other Topics Concern   Not on file  Social History Narrative   Moved from [place] 2017   2nd marriage    Social Determinants of Health   Financial Resource Strain: Not on file  Food Insecurity: Not on file  Transportation Needs: Not on file  Physical Activity: Not on file  Stress: Not on file  Social Connections: Not on file  Intimate Partner Violence:  Not on file    Past Surgical History:  Procedure Laterality Date   LASIK  1996   MEDIAL PARTIAL KNEE REPLACEMENT Right 2016   in [place]        Current Outpatient Medications:    cyanocobalamin (VITAMIN B12) 1000 MCG tablet, Take 1,000 mcg by mouth daily., Disp: , Rfl:    Ergocalciferol (VITAMIN D2) 10 MCG (400 UNIT) TABS, Take by mouth daily at 6 (six) AM., Disp: , Rfl:    Vitamin E 15 MG/0.67ML SOLN, Take by mouth daily at 6 (six) AM., Disp: , Rfl:     Physical Exam: There were no vitals taken for this visit.    Affect appropriate Healthy:  appears stated age 85: normal Neck supple with no adenopathy JVP normal no bruits no thyromegaly Lungs clear with no wheezing and good diaphragmatic motion Heart:  S1/S2 no murmur, no rub, gallop or click PMI normal Abdomen: benighn, BS positve, no tenderness, no AAA no bruit.  No HSM or HJR Distal pulses intact with no bruits No edema Neuro  non-focal Skin warm and dry No muscular weakness   Labs:   Lab Results  Component Value Date   WBC 6.4 10/02/2020   HGB 15.5 10/02/2020   HCT 44.0 10/02/2020   MCV 88 10/02/2020   PLT 215 10/02/2020    No results for input(s): "NA", "K", "CL", "CO2", "BUN", "CREATININE", "CALCIUM", "PROT", "BILITOT", "ALKPHOS", "ALT", "AST", "GLUCOSE" in the last 168 hours.  Invalid input(s): "LABALBU"  No results found for: "CKTOTAL", "CKMB", "CKMBINDEX", "TROPONINI"  Lab Results  Component Value Date   CHOL 178 01/16/2021   CHOL 236 (H) 10/02/2020   CHOL 196 10/27/2018   Lab Results  Component Value Date   HDL 47 01/16/2021   HDL 61 10/02/2020   HDL 48.20 10/27/2018   Lab Results  Component Value Date   LDLCALC 113 (H) 01/16/2021   LDLCALC 163 (H) 10/02/2020   LDLCALC 131 (H) 10/27/2018   Lab Results  Component Value Date   TRIG 98 01/16/2021   TRIG 71 10/02/2020   TRIG 84.0 10/27/2018   Lab Results  Component Value Date   CHOLHDL 3.8 01/16/2021   CHOLHDL 3.9 10/02/2020   CHOLHDL 4 10/27/2018   No results found for: "LDLDIRECT"    Radiology: No results found.   EKG: 01/01/2023  SR normal    ASSESSMENT AND PLAN:   1. Chest Pain:  Family history, elevated lipids not on Rx non obstructive disease LAD/d1 cardiac CT 2019 normal ETT at 17 METS 10/02/20 observe 2. HLD:  Intolerant to statins refer to lipid clinic start PSK9 Lipid and liver test today  3. MVP:  Historical diagnosis no murmur on exam observe 4. Anxiety improved f/u primary   F/U in a year  Lipid/Liver  F/U lipid clinic start ? PSK9  Signed: [place] Rouge 01/01/2023, 8:16 AM

## 2023-01-01 ENCOUNTER — Encounter: Payer: Self-pay | Admitting: Cardiovascular Disease

## 2023-01-01 ENCOUNTER — Ambulatory Visit: Payer: Commercial Managed Care - PPO | Attending: Cardiovascular Disease | Admitting: Cardiovascular Disease

## 2023-01-01 VITALS — HR 62 | Ht 74.0 in | Wt 211.0 lb

## 2023-01-01 DIAGNOSIS — I341 Nonrheumatic mitral (valve) prolapse: Secondary | ICD-10-CM | POA: Diagnosis not present

## 2023-01-01 DIAGNOSIS — E785 Hyperlipidemia, unspecified: Secondary | ICD-10-CM

## 2023-01-01 DIAGNOSIS — F411 Generalized anxiety disorder: Secondary | ICD-10-CM

## 2023-01-01 LAB — LIPID PANEL
Chol/HDL Ratio: 4.4 ratio (ref 0.0–5.0)
Cholesterol, Total: 218 mg/dL — ABNORMAL HIGH (ref 100–199)
HDL: 49 mg/dL (ref >39)
LDL Chol Calc (NIH): 151 mg/dL — ABNORMAL HIGH (ref 0–99)
Triglycerides: 99 mg/dL (ref 0–149)
VLDL Cholesterol Cal: 18 mg/dL (ref 5–40)

## 2023-01-01 LAB — HEPATIC FUNCTION PANEL
ALT: 20 IU/L (ref 0–44)
AST: 22 IU/L (ref 0–40)
Albumin: 4.7 g/dL (ref 3.9–4.9)
Alkaline Phosphatase: 60 IU/L (ref 44–121)
Bilirubin Total: 0.7 mg/dL (ref 0.0–1.2)
Bilirubin, Direct: 0.15 mg/dL (ref 0.00–0.40)
Total Protein: 6.5 g/dL (ref 6.0–8.5)

## 2023-01-01 NOTE — Patient Instructions (Addendum)
Medication Instructions:  Your physician recommends that you continue on your current medications as directed. Please refer to the Current Medication list given to you today.  *If you need a refill on your cardiac medications before your next appointment, please call your pharmacy*  Lab Work: Your physician recommends that you have lab work today- fasting lipid and liver panel.  If you have labs (blood work) drawn today and your tests are completely normal, you will receive your results only by: [place] (if you have MyChart) OR A paper copy in the mail If you have any lab test that is abnormal or we need to change your treatment, we will call you to review the results.  Testing/Procedures: None ordered today.  Follow-Up: At [facility], you and your health needs are our priority.  As part of our continuing mission to provide you with exceptional heart care, we have created designated Provider Care Teams.  These Care Teams include your primary Cardiologist (physician) and Advanced Practice Providers (APPs -  Physician Assistants and Nurse Practitioners) who all work together to provide you with the care you need, when you need it.  We recommend signing up for the patient portal called "MyChart".  Sign up information is provided on this After Visit Summary.  MyChart is used to connect with patients for Virtual Visits (Telemedicine).  Patients are able to view lab/test results, encounter notes, upcoming appointments, etc.  Non-urgent messages can be sent to your provider as well.   To learn more about what you can do with MyChart, go to [email or web address].    Your next appointment:   1 year(s)  Provider:   [place] Rouge, MD     Other Instructions Your physician recommends that you schedule a follow-up appointment with lipid clinic next available.

## 2023-02-09 ENCOUNTER — Ambulatory Visit: Payer: Commercial Managed Care - PPO | Attending: Internal Medicine | Admitting: Pharmacist

## 2023-02-09 ENCOUNTER — Telehealth: Payer: Self-pay | Admitting: Pharmacist

## 2023-02-09 DIAGNOSIS — E785 Hyperlipidemia, unspecified: Secondary | ICD-10-CM

## 2023-02-09 NOTE — Progress Notes (Signed)
Patient ID: CALOGERO BOOSE                 DOB: 1961/11/15                    MRN: [id number]     HPI: Nathan Ward is a 62 y.o. male patient referred to lipid clinic by Dr Johnsie Cancel. PMH is significant for cardiac CTA 03/2018 with calcium score of 8 (43rd percentile) and nonobstructive disease in distal LAD and D1, NVP, HLD, and anxiety. Reported cognitive changes on atorvastatin. Started ezetimibe in 2022, then [place]. No longer on any lipid lowering medication and re-referred to PharmD.  Pt prevoiusly experienced memory issues on atorvastatin. Took ezetimibe which wasn't effective in controlling his cholesterol. Then started Repatha which he tolerated well. Had issues with insurance continuing to cover, he didn't come for follow up labs so insurance didn't reauthorize request. Very busy with work and couldn't make it in. Now prioritizing his health more. Strong Fhx of CAD - brother with widowmaker ~55, grandfather died from MI at age 40. 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  Repatha copay card previously activated in 2022 BIN [id number] PCN CN GRP [id number] ID [id number]  Current Medications: none Intolerances: atorvastatin '10mg'$  daily - cognitive changes Risk Factors: elevated calcium score, elevated Lp(a) LDL goal: '70mg'$ /dL  Family History: Father with colon cancer, DM. Brother with MI, maternal uncle with DM, maternal grandfather with MI.  Social History: Occasional alcohol use.  Labs: 01/01/23: TC 218, TG 99, HDL 49, LDL 151 (no LLT)  Past Medical History:  Diagnosis Date   Anxiety    GERD (gastroesophageal reflux disease)    Heart murmur    MVP   Hyperlipidemia    Mitral valve prolapse 1976    Current Outpatient Medications on File Prior to Visit  Medication Sig Dispense Refill   cyanocobalamin (VITAMIN B12) 1000 MCG tablet Take 1,000 mcg by mouth daily.     Ergocalciferol (VITAMIN D2) 10 MCG (400 UNIT) TABS Take by mouth daily at 6 (six) AM.     Vitamin E 15 MG/0.67ML SOLN  Take by mouth daily at 6 (six) AM.     No current facility-administered medications on file prior to visit.    Allergies  Allergen Reactions   Sulfamethoxazole Hives    Assessment/Plan:  1. Hyperlipidemia - LDL 151 above goal < 70 due to elevated calcium score and Lp(a). Intolerant to atorvastatin, ezetimibe ineffective. Previously took [place] which he tolerated well. Wishes to resume this .WIll submit prior authorization and follow up with pt once approved.  Savalas Monje E. Preciosa Bundrick, PharmD, BCACP, [facility] [facility]. [address] [place], [place] [address] Phone: [phone]; Fax: [phone] 02/09/2023 3:17 PM

## 2023-02-09 NOTE — Telephone Encounter (Signed)
New start Repatha PA submitted, Key: [id number].

## 2023-02-09 NOTE — Patient Instructions (Addendum)
Your LDL cholesterol is 151 and your goal is < 70  I will submit information to your insurance for Repatha and let you know when I hear back.    Repatha is a subcutaneous injection given once every 2 weeks in the fatty tissue of your stomach or upper outer thigh. Store the medication in the fridge. You can let your dose warm up to room temperature for 30 minutes before injecting if you prefer. Repatha will lower your LDL cholesterol by 60% and helps to lower your chance of having a heart attack or stroke.  Prior Repatha copay card - if it needs to be reactivated, call Repatha Ready at [place] PCN CN GRP [id number] ID [id number]

## 2023-02-10 NOTE — Telephone Encounter (Signed)
PA request denied because whoever reviewed request did so incorrectly and stated that pt did not show reduction in LDL on therapy. It was a new start request. This info has again been sent to insurance - appeals submitted online via cover my meds.

## 2023-02-13 NOTE — Telephone Encounter (Signed)
Appeal still pending, pt aware. Pt found old labs with Lp(a) elevation and will either fax it over or drop it off to clinic for our records.

## 2023-02-16 NOTE — Telephone Encounter (Signed)
Appeal still pending. Can call [employer] at [phone] for status update in another week or so.

## 2023-02-20 NOTE — Telephone Encounter (Signed)
Called insurance for status update on Repatha appeals. Still pending. Reference # [id number].

## 2023-02-23 MED ORDER — REPATHA SURECLICK 140 MG/ML ~~LOC~~ SOAJ: 1.0000 | SUBCUTANEOUS | 3 refills | Status: DC

## 2023-02-23 NOTE — Telephone Encounter (Addendum)
Appeals overturned, Repatha now approved through 02/21/24. Rx sent to pharmacy.  Called pt, no answer, left detailed message. Will need f/u labs scheduled. Pt has copay card info to reactivate from when he took med previously.

## 2023-02-25 NOTE — Telephone Encounter (Addendum)
Pt called clinic, found report of Lp(a) from 01/21/18 - will drop this off in clinic for our records. States pharmacy quoted him $29 for a 3 month supply of Repatha. Provided him with Repatha Ready # again to reactivate his copay card from a few years ago which would bring his copay down to $15 for a 3 month supply. F/u labs scheduled.

## 2023-03-02 NOTE — Telephone Encounter (Signed)
Pt dropped off advanced lipid test he had checked 01/21/2018 for reference. At that time, apoB was 118, Lp(a) 105.5, CRP 1.2, TC 226, LDL 140, HDL 43, TG 173, nonHDL 183, LDL particle 978. Report has been scanned in under media tab.  He has been started on Repatha which should help lower his Lp(a) by ~25%. Left message for pt that I have received his labs, no med changes needed based on them.

## 2023-05-08 ENCOUNTER — Ambulatory Visit: Payer: Commercial Managed Care - PPO

## 2023-05-11 ENCOUNTER — Telehealth: Payer: Self-pay | Admitting: Pharmacist

## 2023-05-11 NOTE — Telephone Encounter (Signed)
Left detailed message for pt reschedule lipid labs he missed after starting Repatha to assess efficacy.

## 2023-06-24 ENCOUNTER — Ambulatory Visit: Payer: Commercial Managed Care - PPO

## 2023-12-10 ENCOUNTER — Telehealth: Payer: Self-pay

## 2023-12-10 NOTE — Telephone Encounter (Signed)
 Copied from CRM [id number]. Topic: Clinical - Medical Advice >> Dec 10, 2023  3:54 PM Corean SAUNDERS wrote: Reason for CRM: Patient states he has Norovirus and can't stop throwing up, Patient is requesting Dr. Amon to send him in a zofran  prescription to his pharmacy. I advised the patient that since has not seen Dr. Amon since 2021 he may not be able to send in the prescription. Patient states he is upset by this and that he will drop Dr. Amon as his doctor and that if he has to come into the office he will get everyone sick

## 2023-12-10 NOTE — Telephone Encounter (Signed)
 LMOM informing Pt he has not been seen since 11/2020- [place] policy is 3 years, he is currently not a Pt of Dr. Leta Jungling. He would have to re-establish and Dr. Drue Novel is not taking new patients at this time. I recommended that he go to [facility] or ED for evaluation.

## 2024-03-09 ENCOUNTER — Other Ambulatory Visit: Payer: Self-pay | Admitting: Cardiovascular Disease

## 2024-03-09 DIAGNOSIS — E785 Hyperlipidemia, unspecified: Secondary | ICD-10-CM

## 2024-03-21 ENCOUNTER — Telehealth: Payer: Self-pay

## 2024-03-21 NOTE — Telephone Encounter (Signed)
 Copied from CRM [id number]. Topic: Appointments - Appointment Scheduling >> Mar 21, 2024  2:45 PM Leory Rands wrote: Patient is calling to get approval from Dr. Robina Chol to become a New Patient. Patient is a friend of Wilhelm Hansen, patient od Dr. Robina Chol.

## 2024-03-24 ENCOUNTER — Ambulatory Visit (INDEPENDENT_AMBULATORY_CARE_PROVIDER_SITE_OTHER): Payer: PRIVATE HEALTH INSURANCE | Admitting: Family Medicine

## 2024-03-24 VITALS — BP 110/70 | HR 87 | Ht 73.0 in | Wt 205.8 lb

## 2024-03-24 DIAGNOSIS — E782 Mixed hyperlipidemia: Secondary | ICD-10-CM

## 2024-03-24 DIAGNOSIS — R202 Paresthesia of skin: Secondary | ICD-10-CM | POA: Diagnosis not present

## 2024-03-24 DIAGNOSIS — Z23 Encounter for immunization: Secondary | ICD-10-CM | POA: Diagnosis not present

## 2024-03-24 DIAGNOSIS — J301 Allergic rhinitis due to pollen: Secondary | ICD-10-CM

## 2024-03-24 DIAGNOSIS — R1012 Left upper quadrant pain: Secondary | ICD-10-CM

## 2024-03-24 NOTE — Progress Notes (Signed)
   Subjective:    Patient ID: Nathan Ward, male    DOB: 1961/09/01, 63 y.o.   MRN: [id number]  HPI He is here for an initial visit.  He does complain of a several month history of left upper quadrant discomfort that is intermittent in nature.  He cannot relate this to food, bowel habits, stress.  There is no associated nausea, vomiting, diarrhea.  He has had previous colonoscopies that were negative.  He does have a history of hyperlipidemia and presently is on Repatha.  Also has underlying allergies.  He also complains of a tingling sensation in his feet but no weakness tingling or falling.  He does admit to being under stress mainly due to busy work schedule.  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   Review of Systems     Objective:    Physical Exam Alert and in no distress. Tympanic membranes and canals are normal. Pharyngeal area is normal. Neck is supple without adenopathy or thyromegaly. Cardiac exam shows a regular sinus rhythm without murmurs or gallops. Lungs are clear to auscultation.        Assessment & Plan:  Seasonal allergic rhinitis due to pollen  Left upper quadrant abdominal pain - Plan: CBC with Differential/Platelet, Comprehensive metabolic panel with GFR  Tingling of both feet  Need for vaccination against Streptococcus pneumoniae - Plan: Pneumococcal conjugate vaccine 20-valent (Prevnar 20)  Mixed hyperlipidemia - Plan: Lipid panel  I explained that at this time I see no major issues for the abdominal pain but recommend that he keep track of anything that makes it better or worse to see if we get a better handle on this.  Also said the same thing about the tingling sensation. I then discussed stress and stress management with him and encouraged him to continue with his exercise regimen as well as to make sure that he takes time out of the day and his life for himself.

## 2024-03-25 ENCOUNTER — Encounter: Payer: Self-pay | Admitting: Family Medicine

## 2024-03-25 LAB — COMPREHENSIVE METABOLIC PANEL WITH GFR
ALT: 24 IU/L (ref 0–44)
AST: 25 IU/L (ref 0–40)
Albumin: 4.5 g/dL (ref 3.9–4.9)
Alkaline Phosphatase: 68 IU/L (ref 44–121)
BUN/Creatinine Ratio: 12 (ref 10–24)
BUN: 12 mg/dL (ref 8–27)
Bilirubin Total: 0.7 mg/dL (ref 0.0–1.2)
CO2: 22 mmol/L (ref 20–29)
Calcium: 9.2 mg/dL (ref 8.6–10.2)
Chloride: 104 mmol/L (ref 96–106)
Creatinine, Ser: 0.98 mg/dL (ref 0.76–1.27)
Globulin, Total: 2 g/dL (ref 1.5–4.5)
Glucose: 95 mg/dL (ref 70–99)
Potassium: 4.1 mmol/L (ref 3.5–5.2)
Sodium: 140 mmol/L (ref 134–144)
Total Protein: 6.5 g/dL (ref 6.0–8.5)
eGFR: 86 mL/min/{1.73_m2} (ref >59)

## 2024-03-25 LAB — CBC WITH DIFFERENTIAL/PLATELET
Basophils Absolute: 0.1 10*3/uL (ref 0.0–0.2)
Basos: 1 %
EOS (ABSOLUTE): 0.2 10*3/uL (ref 0.0–0.4)
Eos: 2 %
Hematocrit: 42.9 % (ref 37.5–51.0)
Hemoglobin: 14.6 g/dL (ref 13.0–17.7)
Immature Grans (Abs): 0 10*3/uL (ref 0.0–0.1)
Immature Granulocytes: 0 %
Lymphocytes Absolute: 2 10*3/uL (ref 0.7–3.1)
Lymphs: 22 %
MCH: 30.9 pg (ref 26.6–33.0)
MCHC: 34 g/dL (ref 31.5–35.7)
MCV: 91 fL (ref 79–97)
Monocytes Absolute: 1 10*3/uL — ABNORMAL HIGH (ref 0.1–0.9)
Monocytes: 11 %
Neutrophils Absolute: 5.7 10*3/uL (ref 1.4–7.0)
Neutrophils: 64 %
Platelets: 229 10*3/uL (ref 150–450)
RBC: 4.72 x10E6/uL (ref 4.14–5.80)
RDW: 12.3 % (ref 11.6–15.4)
WBC: 9 10*3/uL (ref 3.4–10.8)

## 2024-03-25 LAB — LIPID PANEL
Chol/HDL Ratio: 2.1 ratio (ref 0.0–5.0)
Cholesterol, Total: 120 mg/dL (ref 100–199)
HDL: 58 mg/dL (ref >39)
LDL Chol Calc (NIH): 48 mg/dL (ref 0–99)
Triglycerides: 69 mg/dL (ref 0–149)
VLDL Cholesterol Cal: 14 mg/dL (ref 5–40)

## 2024-04-07 ENCOUNTER — Telehealth: Payer: Self-pay | Admitting: Cardiovascular Disease

## 2024-04-07 NOTE — Telephone Encounter (Signed)
*  STAT* If patient is at the pharmacy, call can be transferred to refill team.   1. Which medications need to be refilled? (please list name of each medication and dose if known)   Evolocumab  (REPATHA  SURECLICK) 140 MG/ML SOAJ   2. Would you like to learn more about the convenience, safety, & potential cost savings by using the [facility]?   3. Are you open to using the [facility] (Type [facility]. ).  4. Which pharmacy/location (including street and city if local pharmacy) is medication to be sent to?  [id number] - [place], [place] - [address] AT [facility] OF [address]   5. Do they need a 30 day or 90 day supply?   Patient stated he is completely out of this medication.  Patient noted he did complete his lab work as ordered.

## 2024-04-08 MED ORDER — REPATHA SURECLICK 140 MG/ML ~~LOC~~ SOAJ: 140.0000 mg | SUBCUTANEOUS | 3 refills | Status: AC

## 2024-04-12 ENCOUNTER — Other Ambulatory Visit (HOSPITAL_COMMUNITY): Payer: Self-pay

## 2024-04-22 ENCOUNTER — Telehealth: Payer: Self-pay | Admitting: Pharmacy Technician

## 2024-04-22 ENCOUNTER — Other Ambulatory Visit (HOSPITAL_COMMUNITY): Payer: Self-pay

## 2024-04-22 ENCOUNTER — Telehealth: Payer: Self-pay | Admitting: Cardiovascular Disease

## 2024-04-22 NOTE — Telephone Encounter (Signed)
 Pharmacy Patient Advocate Encounter   Received notification from Pt Calls Messages that prior authorization for REPATHA  is required/requested.   Insurance verification completed.   The patient is insured through [facility] .   Per test claim: PA required; PA submitted to above mentioned insurance via CoverMyMeds Key/confirmation #/EOC [facility] Status is pending

## 2024-04-22 NOTE — Telephone Encounter (Signed)
 Pt c/o medication issue:  1. Name of Medication:  Evolocumab  (REPATHA  SURECLICK) 140 MG/ML SOAJ   2. How are you currently taking this medication (dosage and times per day)?   Inject 140 mg into the skin every 14 (fourteen) days.    3. Are you having a reaction (difficulty breathing--STAT)? No  4. What is your medication issue? Patient went to get this medication from the pharmacy and learned that he is needing PA for this medication. Patient recently switch his insurance and is now covered by the [employer]. Patient stated his next dosage was supposed to yesterday. Per patient, the insurance company said to mark this as urgent for a quick turn around time. Patient is requesting for a status update once we start the PA. Please advise.

## 2024-04-22 NOTE — Telephone Encounter (Signed)
 Error

## 2024-04-25 NOTE — Telephone Encounter (Signed)
 Pharmacy Patient Advocate Encounter  Received notification from [facility] that Prior Authorization for repatha  has been APPROVED from 04/22/24 to 10/23/24. Spoke to pharmacy to process.Copay is $per pharmacy he got 04/20/24 and it was sold that day.    PA #/Case ID/Reference #: [id number]

## 2024-05-18 ENCOUNTER — Ambulatory Visit (HOSPITAL_BASED_OUTPATIENT_CLINIC_OR_DEPARTMENT_OTHER): Admitting: Family Medicine

## 2024-07-25 NOTE — Progress Notes (Signed)
 CARDIOLOGY CONSULT NOTE       Patient ID: Nathan Ward MRN: [id number] DOB/AGE: 04/29/1961 63 y.o.  Admit date: (Not on file) Referring Physician: Amon Primary Physician: Nathan Norleen [name], MD Primary Cardiologist: Nathan Ward   HPI:  63 y.o. 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 Having issues with primary/secondary insurance coverage for his Repatha  Interested in seeing Dr Nathan Ward for LpA study  ROS All other systems reviewed and negative except as noted above  Past Medical History:  Diagnosis Date   Anxiety    GERD (gastroesophageal reflux disease)    Heart murmur    MVP   Hyperlipidemia    Mitral valve prolapse 1976    Family History  Problem Relation Age of Onset   Colon cancer Father        75   Colon polyps Father    Diabetes Father    Other Father        acute myelogenous leukemia    Lymphoma Father        non-Hodgkin's   Heart disease Brother    Heart attack Brother    Diabetes Maternal Uncle    Other Maternal Uncle        heart surgery: valve sx   Heart disease Maternal Grandfather    Heart attack Maternal Grandfather    Prostate cancer Neg Hx     Social History   Socioeconomic History   Marital status: Married    Spouse name: Not on file   Number of children: 3   Years of education: Not on file   Highest education level: Not on file  Occupational History   Occupation: real state   Tobacco Use   Smoking status: Never   Smokeless tobacco: Never  Vaping Use   Vaping status: Never Used  Substance and Sexual Activity   Alcohol use: Yes    Alcohol/week: 1.0 standard drink of alcohol    Types: 1 Glasses of wine per week    Comment: occ   Drug use: No   Sexual activity: Yes  Other Topics Concern   Not on file  Social History Narrative   Moved from [place] 2017   2nd marriage    Social Drivers of Corporate investment banker Strain:  Not on file  Food Insecurity: Not on file  Transportation Needs: Not on file  Physical Activity: Not on file  Stress: Not on file  Social Connections: Unknown (04/22/2022)   Received [facility]   Social Network    Social Network: Not on file  Intimate Partner Violence: Unknown (03/14/2022)   Received from [facility]   HITS    Physically Hurt: Not on file    Insult or Talk Down To: Not on file    Threaten Physical Harm: Not on file    Scream or Curse: Not on file    Past Surgical History:  Procedure Laterality Date   LASIK  1996   MEDIAL PARTIAL KNEE REPLACEMENT Right 2016   in [place]   MOUTH SURGERY     VASECTOMY        Current Outpatient Medications:    ascorbic acid (VITAMIN C) 500 MG tablet, Take 500 mg by mouth daily., Disp: , Rfl:    Cholecalciferol (VITAMIN D-3) 25 MCG (1000 UT) CAPS, Take 25 capsules by mouth., Disp: , Rfl:    cyanocobalamin (VITAMIN B12) 1000 MCG tablet, Take 1,000 mcg by mouth  daily., Disp: , Rfl:    Evolocumab  (REPATHA  SURECLICK) 140 MG/ML SOAJ, Inject 140 mg into the skin every 14 (fourteen) days., Disp: 6 mL, Rfl: 3   Evolocumab  (REPATHA ) 140 MG/ML SOSY, Inject 140 mg into the skin every 14 (fourteen) days., Disp: 6 mL, Rfl: 2   L-METHYLFOLATE CALCIUM  PO, Take by mouth., Disp: , Rfl:    Omega 3 1200 MG CAPS, Take by mouth., Disp: , Rfl:    Vitamin A 2400 MCG (8000 UT) CAPS, Take by mouth., Disp: , Rfl:    Vitamin E (VITAMIN E/D-ALPHA NATURAL) 268 MG (400 UNIT) CAPS, Take 15 mg by mouth daily., Disp: , Rfl:    Vitamin E 15 MG/0.67ML SOLN, Take by mouth daily at 6 (six) AM., Disp: , Rfl:     Physical Exam: Blood pressure 108/72, pulse 62, height 6' 1 (1.854 m), weight 212 lb 6.4 oz (96.3 kg), SpO2 98%.    Affect appropriate Healthy:  appears stated age HEENT: normal Neck supple with no adenopathy JVP normal no bruits no thyromegaly Lungs clear with no wheezing and good diaphragmatic motion Heart:  S1/S2 no murmur, no rub, gallop or click PMI normal Abdomen: benighn, BS positve, no tenderness, no AAA no bruit.  No HSM or HJR Distal pulses intact with no bruits No edema Neuro non-focal Skin warm and dry No muscular weakness   Labs:   Lab Results  Component Value Date   WBC 9.0 03/24/2024   HGB 14.6 03/24/2024   HCT 42.9 03/24/2024   MCV 91 03/24/2024   PLT 229 03/24/2024    No results for input(s): NA, K, CL, CO2, BUN, CREATININE, CALCIUM , PROT, BILITOT, ALKPHOS, ALT, AST, GLUCOSE in the last 168 hours.  Invalid input(s): LABALBU  No results found for: CKTOTAL, CKMB, CKMBINDEX, TROPONINI  Lab Results  Component Value Date   CHOL 120 03/24/2024   CHOL 218 (H) 01/01/2023   CHOL 178 01/16/2021   Lab Results  Component Value Date   HDL 58 03/24/2024   HDL 49 01/01/2023   HDL 47 01/16/2021   Lab Results  Component Value Date   LDLCALC 48 03/24/2024   LDLCALC 151 (H) 01/01/2023   LDLCALC 113 (H)  01/16/2021   Lab Results  Component Value Date   TRIG 69 03/24/2024   TRIG 99 01/01/2023   TRIG  98 01/16/2021   Lab Results  Component Value Date   CHOLHDL 2.1 03/24/2024   CHOLHDL 4.4 01/01/2023   CHOLHDL 3.8 01/16/2021   No results found for: LDLDIRECT    Radiology: No results found.   EKG: 08/01/2024  SR normal    ASSESSMENT AND PLAN:   1. Chest Pain:  Family history, elevated lipids not on Rx non obstructive disease LAD/d1 cardiac CT 2019 normal ETT at 17 METS 10/02/20 Update calcium  score if significant change/increase can consider f/u ETT    2. HLD:  Intolerant to statins on repatha  with LDL at goal  3. MVP:  Historical diagnosis no murmur on exam observe 4. Anxiety improved f/u primary   Calcium  Score  F/U [facility] in 6 months ? LpA research trials   F/U in a year   Signed: Maude Emmer 08/01/2024, 8:49 AM

## 2024-07-29 ENCOUNTER — Other Ambulatory Visit (HOSPITAL_COMMUNITY): Payer: Self-pay

## 2024-07-29 MED ORDER — REPATHA SURECLICK 140 MG/ML ~~LOC~~ SOAJ
140.0000 mg | SUBCUTANEOUS | 2 refills | Status: AC
  Filled 2024-07-29 – 2024-08-01 (×4): qty 6, 84d supply, fill #0
  Filled 2024-10-31 – 2024-11-09 (×2): qty 6, 84d supply, fill #1

## 2024-08-01 ENCOUNTER — Other Ambulatory Visit (HOSPITAL_COMMUNITY): Payer: Self-pay

## 2024-08-01 ENCOUNTER — Ambulatory Visit: Attending: Cardiovascular Disease | Admitting: Cardiovascular Disease

## 2024-08-01 ENCOUNTER — Encounter: Payer: Self-pay | Admitting: Cardiovascular Disease

## 2024-08-01 VITALS — BP 108/72 | HR 62 | Ht 73.0 in | Wt 212.4 lb

## 2024-08-01 DIAGNOSIS — I341 Nonrheumatic mitral (valve) prolapse: Secondary | ICD-10-CM

## 2024-08-01 DIAGNOSIS — E785 Hyperlipidemia, unspecified: Secondary | ICD-10-CM | POA: Diagnosis not present

## 2024-08-01 NOTE — Patient Instructions (Addendum)
 Medication Instructions:  No Changes *If you need a refill on your cardiac medications before your next appointment, please call your pharmacy*  Lab Work: None  Testing/Procedures: Dr. Delford has ordered a CT coronary calcium  score.   Test locations:  [facility] at [facility] [place]  [facility] at [facility]  This is $99 out of pocket.   Coronary CalciumScan A coronary calcium  scan is an imaging test used to look for deposits of calcium  and other fatty materials (plaques) in the inner lining of the blood vessels of the heart (coronary arteries). These deposits of calcium  and plaques can partly clog and narrow the coronary arteries without producing any symptoms or warning signs. This puts a person at risk for a heart attack. This test can detect these deposits before symptoms develop. Tell a health care provider about: Any allergies you have. All medicines you are taking, including vitamins, herbs, eye drops, creams, and over-the-counter medicines. Any problems you or family members have had with anesthetic medicines. Any blood disorders you have. Any surgeries you have had. Any medical conditions you have. Whether you are pregnant or may be pregnant. What are the risks? Generally, this is a safe procedure. However, problems may occur, including: Harm to a pregnant woman and her unborn baby. This test involves the use of radiation. Radiation exposure can be dangerous to a pregnant woman and her unborn baby. If you are pregnant, you generally should not have this procedure done. Slight increase in the risk of cancer. This is because of the radiation involved in the test. What happens before the procedure? No preparation is needed for this procedure. What happens during the procedure? You will undress and remove any jewelry around your neck or chest. You will put on a hospital  gown. Sticky electrodes will be placed on your chest. The electrodes will be connected to an electrocardiogram (ECG) machine to record a tracing of the electrical activity of your heart. A CT scanner will take pictures of your heart. During this time, you will be asked to lie still and hold your breath for 2-3 seconds while a picture of your heart is being taken. The procedure may vary among health care providers and hospitals. What happens after the procedure? You can get dressed. You can return to your normal activities. It is up to you to get the results of your test. Ask your health care provider, or the department that is doing the test, when your results will be ready. Summary A coronary calcium  scan is an imaging test used to look for deposits of calcium  and other fatty materials (plaques) in the inner lining of the blood vessels of the heart (coronary arteries). Generally, this is a safe procedure. Tell your health care provider if you are pregnant or may be pregnant. No preparation is needed for this procedure. A CT scanner will take pictures of your heart. You can return to your normal activities after the scan is done. This information is not intended to replace advice given to you by your health care provider. Make sure you discuss any questions you have with your health care provider. Document Released: 05/22/2008 Document Revised: 10/13/2016 Document Reviewed: 10/13/2016 Elsevier Interactive Patient Education  2017 [employer].   Follow-Up: At [facility], you and your health needs are our priority.  As part of our continuing mission to provide you with exceptional heart care, our providers are all part of  one team.  This team includes your primary Cardiologist (physician) and Advanced Practice Providers or APPs (Physician Assistants and Nurse Practitioners) who all work together to provide you with the care you need, when you need it.  Your next appointment:   6  month(s) with Dr. Vinie Maxcy (Due around the end of February 2026); Dr. Maxcy is the specialist for Familial Hypercholesterolemia  Provider:   Then, Maude Emmer, MD will see you in One Year   Other Instructions Please call us  or send a MyChart message with any Cardiology related questions/concerns.  [phone].  Thank you!

## 2024-08-18 ENCOUNTER — Ambulatory Visit (HOSPITAL_COMMUNITY)

## 2024-09-01 ENCOUNTER — Other Ambulatory Visit (HOSPITAL_COMMUNITY): Payer: Self-pay

## 2024-09-01 MED ORDER — COVID-19 MRNA VAC-TRIS(PFIZER) 30 MCG/0.3ML IM SUSY
0.3000 mL | PREFILLED_SYRINGE | Freq: Once | INTRAMUSCULAR | 0 refills | Status: AC
  Filled 2024-09-01 – 2024-09-05 (×2): qty 0.3, 1d supply, fill #0

## 2024-09-01 MED ORDER — FLUZONE HIGH-DOSE 0.5 ML IM SUSY
0.5000 mL | PREFILLED_SYRINGE | Freq: Once | INTRAMUSCULAR | 0 refills | Status: AC
  Filled 2024-09-01 – 2024-09-05 (×2): qty 0.5, 1d supply, fill #0

## 2024-09-02 ENCOUNTER — Other Ambulatory Visit (HOSPITAL_COMMUNITY): Payer: Self-pay

## 2024-09-05 ENCOUNTER — Other Ambulatory Visit (HOSPITAL_COMMUNITY): Payer: Self-pay

## 2024-10-31 ENCOUNTER — Telehealth (HOSPITAL_COMMUNITY): Payer: Self-pay

## 2024-10-31 ENCOUNTER — Telehealth: Payer: Self-pay | Admitting: Cardiovascular Disease

## 2024-10-31 ENCOUNTER — Other Ambulatory Visit (HOSPITAL_COMMUNITY): Payer: Self-pay

## 2024-10-31 NOTE — Telephone Encounter (Signed)
 Patient called stating he needs a prior auth for his Evolocumab  (REPATHA  SURECLICK) 140 MG/ML SOAJ.

## 2024-10-31 NOTE — Telephone Encounter (Signed)
 Pharmacy Patient Advocate Encounter   Received notification from Physician's Office that prior authorization for REPATHA  is required/requested.   Insurance verification completed.   The patient is insured through [employer] [facility].   Per test claim: PA required; PA submitted to above mentioned insurance via Latent Key/confirmation #/EOC [id number] Status is pending

## 2024-11-02 NOTE — Telephone Encounter (Signed)
 Please see separate encounter.   PA has been submitted.

## 2024-11-09 ENCOUNTER — Other Ambulatory Visit (HOSPITAL_COMMUNITY): Payer: Self-pay

## 2024-11-10 NOTE — Telephone Encounter (Signed)
 Pharmacy Patient Advocate Encounter  Received notification from [employer] that Prior Authorization for REPATHA  has been APPROVED from 11/01/24 to 12/07/25
